# Patient Record
Sex: Female | Born: 1995 | Race: Black or African American | Hispanic: No | Smoking: Never smoker
Health system: Southern US, Community
[De-identification: ages and names within clinical notes are randomized; demographics above are authoritative.]

## PROBLEM LIST (undated history)

## (undated) DIAGNOSIS — R0600 Dyspnea, unspecified: Secondary | ICD-10-CM

## (undated) DIAGNOSIS — R069 Unspecified abnormalities of breathing: Secondary | ICD-10-CM

## (undated) HISTORY — PX: OTHER SURGICAL HISTORY: SHX169

## (undated) HISTORY — DX: Dyspnea, unspecified: R06.00

## (undated) HISTORY — DX: Unspecified abnormalities of breathing: R06.9

---

## 2019-03-28 ENCOUNTER — Ambulatory Visit: Payer: Self-pay | Admitting: Advanced Practice Midwife

## 2019-03-28 ENCOUNTER — Other Ambulatory Visit: Payer: Self-pay

## 2019-03-28 ENCOUNTER — Encounter: Payer: Self-pay | Admitting: Advanced Practice Midwife

## 2019-03-28 DIAGNOSIS — Z113 Encounter for screening for infections with a predominantly sexual mode of transmission: Secondary | ICD-10-CM

## 2019-03-28 DIAGNOSIS — B9689 Other specified bacterial agents as the cause of diseases classified elsewhere: Secondary | ICD-10-CM

## 2019-03-28 DIAGNOSIS — N760 Acute vaginitis: Secondary | ICD-10-CM

## 2019-03-28 LAB — WET PREP FOR TRICH, YEAST, CLUE
Trichomonas Exam: NEGATIVE
Yeast Exam: NEGATIVE

## 2019-03-28 MED ORDER — METRONIDAZOLE 500 MG PO TABS: 500.0000 mg | ORAL_TABLET | Freq: Two times a day (BID) | ORAL | 0 refills | Status: AC

## 2019-03-28 NOTE — Progress Notes (Signed)
  [facility] Department STI clinic/screening visit  Subjective:  Meghan Peck is a 24 y.o.SBF nullip nonsmoker female being seen today for an STI screening visit. The patient reports they do not have symptoms.  Patient reports that they do desire a pregnancy in the next year.   They reported they are not interested in discussing contraception today.  No LMP recorded.   Patient has the following medical conditions:  There are no problems to display for this patient.   Chief Complaint  Patient presents with  . SEXUALLY TRANSMITTED DISEASE    HPI  Patient reports increased white malodorous d/c x couple days.  LMP 03/13/19.  Last sex 03/06/19 without condom.  Last MJ 5 years ago  See flowsheet for further details and programmatic requirements.    The following portions of the patient's history were reviewed and updated as appropriate: allergies, current medications, past medical history, past social history, past surgical history and problem list.  Objective:  There were no vitals filed for this visit.  Physical Exam Vitals and nursing note reviewed.  Constitutional:      Appearance: Normal appearance.  HENT:     Head: Normocephalic and atraumatic.     Mouth/Throat:     Mouth: Mucous membranes are moist.     Pharynx: Oropharynx is clear. No oropharyngeal exudate or posterior oropharyngeal erythema.  Pulmonary:     Effort: Pulmonary effort is normal.  Abdominal:     General: Abdomen is flat.     Palpations: Abdomen is soft. There is no mass.     Tenderness: There is no abdominal tenderness. There is no rebound.  Genitourinary:    General: Normal vulva.     Exam position: Lithotomy position.     Pubic Area: No rash or pubic lice.      Labia:        Right: No rash or lesion.        Left: No rash or lesion.      Vagina: Vaginal discharge (thick white curdy leukorrhea with malodor, ph>4.5) present. No erythema, bleeding or lesions.     Cervix: Normal.     Uterus:  Normal.      Adnexa: Right adnexa normal and left adnexa normal.     Rectum: Normal.  Lymphadenopathy:     Head:     Right side of head: No preauricular or posterior auricular adenopathy.     Left side of head: No preauricular or posterior auricular adenopathy.     Cervical: No cervical adenopathy.     Upper Body:     Right upper body: No supraclavicular or axillary adenopathy.     Left upper body: No supraclavicular or axillary adenopathy.     Lower Body: No right inguinal adenopathy. No left inguinal adenopathy.  Skin:    General: Skin is warm and dry.     Findings: No rash.  Neurological:     Mental Status: She is alert and oriented to person, place, and time.      Assessment and Plan:  Meghan Peck is a 24 y.o. female presenting to the [facility] Department for STI screening  1. Screening examination for venereal disease Treat wet mount per standing orders Immunization nurse consult - WET PREP FOR [place], YEAST, [facility]     Return if symptoms worsen or fail to improve.  No future appointments.  Herbie Saxon, CNM

## 2019-03-28 NOTE — Progress Notes (Signed)
Wet mount reviewed by provider; per verbal by Donnal Moat, CNM, pt treated for BV per standing order. Provider orders completed.

## 2019-08-09 ENCOUNTER — Other Ambulatory Visit: Payer: Self-pay

## 2019-08-09 ENCOUNTER — Ambulatory Visit (LOCAL_COMMUNITY_HEALTH_CENTER): Payer: Self-pay

## 2019-08-09 VITALS — BP 122/73 | Ht 60.0 in | Wt 128.0 lb

## 2019-08-09 DIAGNOSIS — Z3201 Encounter for pregnancy test, result positive: Secondary | ICD-10-CM

## 2019-08-09 LAB — PREGNANCY, URINE: Preg Test, Ur: POSITIVE — AB

## 2019-08-09 MED ORDER — PRENATAL 27-0.8 MG PO TABS: 1.0000 | ORAL_TABLET | Freq: Every day | ORAL | 0 refills | Status: AC

## 2019-08-09 NOTE — Progress Notes (Signed)
UPT positive today. Plans prenatal care at [facility]. Sent to clerk for preadmit. Josie Saunders, RN

## 2019-08-12 ENCOUNTER — Other Ambulatory Visit: Payer: Self-pay

## 2019-08-12 ENCOUNTER — Emergency Department: Payer: Self-pay

## 2019-08-12 DIAGNOSIS — Z3A01 Less than 8 weeks gestation of pregnancy: Secondary | ICD-10-CM | POA: Insufficient documentation

## 2019-08-12 DIAGNOSIS — M79605 Pain in left leg: Secondary | ICD-10-CM

## 2019-08-12 DIAGNOSIS — O1202 Gestational edema, second trimester: Secondary | ICD-10-CM | POA: Insufficient documentation

## 2019-08-12 DIAGNOSIS — Z5321 Procedure and treatment not carried out due to patient leaving prior to being seen by health care provider: Secondary | ICD-10-CM | POA: Insufficient documentation

## 2019-08-12 LAB — BASIC METABOLIC PANEL
Anion gap: 6 (ref 5–15)
BUN: 12 mg/dL (ref 6–20)
CO2: 25 mmol/L (ref 22–32)
Calcium: 9.1 mg/dL (ref 8.9–10.3)
Chloride: 104 mmol/L (ref 98–111)
Creatinine, Ser: 0.68 mg/dL (ref 0.44–1.00)
GFR calc Af Amer: >60 mL/min (ref >60)
GFR calc non Af Amer: >60 mL/min (ref >60)
Glucose, Bld: 98 mg/dL (ref 70–99)
Potassium: 4.1 mmol/L (ref 3.5–5.1)
Sodium: 135 mmol/L (ref 135–145)

## 2019-08-12 LAB — CBC
HCT: 34.8 % — ABNORMAL LOW (ref 36.0–46.0)
Hemoglobin: 11.5 g/dL — ABNORMAL LOW (ref 12.0–15.0)
MCH: 25.7 pg — ABNORMAL LOW (ref 26.0–34.0)
MCHC: 33 g/dL (ref 30.0–36.0)
MCV: 77.7 fL — ABNORMAL LOW (ref 80.0–100.0)
Platelets: 307 10*3/uL (ref 150–400)
RBC: 4.48 MIL/uL (ref 3.87–5.11)
RDW: 14.5 % (ref 11.5–15.5)
WBC: 7.2 10*3/uL (ref 4.0–10.5)
nRBC: 0 % (ref 0.0–0.2)

## 2019-08-12 NOTE — ED Triage Notes (Signed)
Pt to the er for left lower leg swelling on the posterior calf with redness and inflammation present. Pt is currently [redacted] weeks pregnant. Area is large and painful.

## 2019-08-13 ENCOUNTER — Emergency Department
Admission: EM | Admit: 2019-08-13 | Discharge: 2019-08-13 | Disposition: A | Discharge status: Home / Self Care | Payer: Self-pay | Attending: Emergency Medicine | Admitting: Emergency Medicine

## 2019-08-13 DIAGNOSIS — M79605 Pain in left leg: Secondary | ICD-10-CM

## 2019-08-13 LAB — FIBRIN DERIVATIVES D-DIMER (ARMC ONLY): Fibrin derivatives D-dimer (ARMC): 261.67 ng/mL (FEU) (ref 0.00–499.00)

## 2019-08-13 NOTE — ED Notes (Signed)
Patient states she is going to go home. Patient educated about when to seek immediate medical attention including fever, pain, inability to bear weight. Patient understands and will see her primary care tomorrow or Monday. Patient encouraged to return for any further complications or concerns.

## 2019-08-15 ENCOUNTER — Telehealth: Payer: Self-pay | Admitting: Emergency Medicine

## 2019-08-15 NOTE — Telephone Encounter (Signed)
Called patient due to lwot to inquire about condition and follow up plans. Says the leg is actually better.  I told her to call [facility] to let them know her labs results are in and how her leg is doing.

## 2019-09-13 ENCOUNTER — Ambulatory Visit: Payer: Self-pay | Admitting: Family Medicine

## 2019-09-13 ENCOUNTER — Other Ambulatory Visit: Payer: Self-pay

## 2019-09-13 ENCOUNTER — Encounter: Payer: Self-pay | Admitting: Family Medicine

## 2019-09-13 DIAGNOSIS — Z113 Encounter for screening for infections with a predominantly sexual mode of transmission: Secondary | ICD-10-CM

## 2019-09-13 LAB — WET PREP FOR TRICH, YEAST, CLUE
Trichomonas Exam: NEGATIVE
Yeast Exam: NEGATIVE

## 2019-09-13 NOTE — Progress Notes (Signed)
Wet mount reviewed, no tx per standing order.

## 2019-09-13 NOTE — Progress Notes (Signed)
Pt is here for STD screening. Pt feels like she may have a yeast infection. Pt reports she is currently pregnant. Pt has New OB appt here scheduled for 09/27/2019.

## 2019-09-13 NOTE — Progress Notes (Signed)
[facility] Department STI clinic/screening visit  Subjective:  Meghan Peck is a 24 y.o. female being seen today for an STI screening visit. The patient reports they do have symptoms.  Patient reports that they are pregnant, + PT 07/2018.[name]  They reported they are not interested in discussing contraception today.  Patient's last menstrual period was 07/02/2019 (exact date).   Patient has the following medical conditions:  There are no problems to display for this patient.   Chief Complaint  Patient presents with   SEXUALLY TRANSMITTED DISEASE    screening    HPI  Patient reports that she used Monistat 1 week ago for yeast like symptoms but this didn't resolve her symptoms.  States that she continues to have a lot of thick white disch with an odor.  Denies other STD symptoms.  Declines blood work because will have next week with her first PN visit at [facility].   Last HIV test per patient/review of record was unknown Patient reports last pap was unknown.   See flowsheet for further details and programmatic requirements.    The following portions of the patient's history were reviewed and updated as appropriate: allergies, current medications, past medical history, past social history, past surgical history and problem list.  Objective:  There were no vitals filed for this visit.  Physical Exam Vitals and nursing note reviewed.  Constitutional:      Appearance: Normal appearance.  HENT:     Head: Normocephalic and atraumatic.     Mouth/Throat:     Mouth: Mucous membranes are moist.     Pharynx: Oropharynx is clear. No oropharyngeal exudate or posterior oropharyngeal erythema.  Pulmonary:     Effort: Pulmonary effort is normal.  Abdominal:     General: Abdomen is flat.     Palpations: There is no mass.     Tenderness: There is no abdominal tenderness. There is no rebound.  Genitourinary:    General: Normal vulva.     Exam position: Lithotomy position.     Pubic  Area: No rash or pubic lice.      Labia:        Right: No rash or lesion.        Left: No rash or lesion.      Vagina: Vaginal discharge present. No erythema, bleeding or lesions.     Cervix: No discharge, friability, lesion or erythema.     Rectum: Normal.     Comments: Small amount of white discharge, pH <4.5, no odor noted. Bimanual exam not indicated Lymphadenopathy:     Head:     Right side of head: No preauricular or posterior auricular adenopathy.     Left side of head: No preauricular or posterior auricular adenopathy.     Cervical: No cervical adenopathy.     Upper Body:     Right upper body: No supraclavicular or axillary adenopathy.     Left upper body: No supraclavicular or axillary adenopathy.     Lower Body: No right inguinal adenopathy. No left inguinal adenopathy.  Skin:    General: Skin is warm and dry.     Findings: No rash.  Neurological:     Mental Status: She is alert and oriented to person, place, and time.      Assessment and Plan:  [name] is a 24 y.o. female presenting to the [facility] Department for STI screening  1. Screening examination for venereal disease  - WET PREP FOR [place], YEAST,  [place] client of negative wet prep results. Increase in discharge may be due to pregnancy.  Co that the wet prep will be repeated at her PN visit exam.   No follow-ups on file.  Future Appointments  Date Time Provider [place]  09/27/2019  9:20 AM [facility] PROVIDER [facility] None    Hassell Done, FNP

## 2019-09-27 ENCOUNTER — Encounter: Payer: Self-pay | Admitting: Advanced Practice Midwife

## 2019-09-27 ENCOUNTER — Other Ambulatory Visit: Payer: Self-pay

## 2019-09-27 ENCOUNTER — Ambulatory Visit: Payer: Self-pay | Admitting: Advanced Practice Midwife

## 2019-09-27 VITALS — BP 126/78 | HR 79 | Temp 98.3°F | Wt 120.0 lb

## 2019-09-27 DIAGNOSIS — Z3401 Encounter for supervision of normal first pregnancy, first trimester: Secondary | ICD-10-CM

## 2019-09-27 DIAGNOSIS — B9689 Other specified bacterial agents as the cause of diseases classified elsewhere: Secondary | ICD-10-CM

## 2019-09-27 DIAGNOSIS — Z3400 Encounter for supervision of normal first pregnancy, unspecified trimester: Secondary | ICD-10-CM | POA: Insufficient documentation

## 2019-09-27 DIAGNOSIS — N760 Acute vaginitis: Secondary | ICD-10-CM

## 2019-09-27 LAB — URINALYSIS
Bilirubin, UA: NEGATIVE
Glucose, UA: NEGATIVE
Ketones, UA: NEGATIVE
Leukocytes,UA: NEGATIVE
Nitrite, UA: NEGATIVE
Protein,UA: NEGATIVE
RBC, UA: NEGATIVE
Specific Gravity, UA: 1.025 (ref 1.005–1.030)
Urobilinogen, Ur: 0.2 mg/dL (ref 0.2–1.0)
pH, UA: 7 (ref 5.0–7.5)

## 2019-09-27 LAB — WET PREP FOR TRICH, YEAST, CLUE
Trichomonas Exam: NEGATIVE
Yeast Exam: NEGATIVE

## 2019-09-27 LAB — HEMOGLOBIN, FINGERSTICK: Hemoglobin: 13.1 g/dL (ref 11.1–15.9)

## 2019-09-27 MED ORDER — METRONIDAZOLE 250 MG PO TABS: 250.0000 mg | ORAL_TABLET | Freq: Three times a day (TID) | ORAL | 0 refills | Status: AC

## 2019-09-27 NOTE — Progress Notes (Signed)
Patient here for new OB at 12 3/7 weeks by LMP. Patient arrived late for appointment due to My Chart appointment reminder that instructed patient to arrive at 8:50am. RN to follow-up with E. Luna about that issue. Patient agrees to do new OB appointment in 2 parts if provider unable to complete PE due to appointment timing.Jenetta Downer, RN

## 2019-09-27 NOTE — Progress Notes (Signed)
[place] DEPT [facility] [place] [id number]  INITIAL PRENATAL VISIT NOTE  Subjective:  Meghan Peck is a 24 y.o. SBF nonsmoker G1P0000 at [redacted]w[redacted]d being seen today to start prenatal care at the [facility] Department. She feels "great" about planned pregnancy x 5 years.  She is not sure how old FOB is but he feels "good" about pregnancy; she states they were never in a relationship but this was "a one time thing" with FOB who is unemployed and will not be supportive of her or the baby and has no other children.  Pt is employed 40 hrs/wk and living alone,  Denies cigs, vaping, [employer] cigars, last MJ 5 years ago, last ETOH 06/25/19 (4 Loco) --1.  Last sex 08/02/19. LMP 07/02/19. Has been to ER x1 because she thought she had a blood clot in her leg after being bitten by something; on 08/12/19 she went to [facility] ER and had u/s of left leg=neg.  LMP 07/02/19. She is currently monitored for the following issues for this low-risk pregnancy and has Encounter for supervision of normal first pregnancy in first trimester on their problem list.  Patient reports no complaints.   .  .   . Denies leaking of fluid.   Indications for ASA therapy (per uptodate) One of the following: Previous pregnancy with preeclampsia, especially early onset and with an adverse outcome No Multifetal gestation No Chronic hypertension No Type 1 or 2 diabetes mellitus No Chronic kidney disease No Autoimmune disease (antiphospholipid syndrome, systemic lupus erythematosus) No  Two or more of the following: Nulliparity Yes Obesity (body mass index >30 kg/m2) No Family history of preeclampsia in mother or sister No Age ?35 years No Sociodemographic characteristics (African American race, low socioeconomic level) Yes Personal risk factors (eg, previous pregnancy with low birth weight or small for gestational age infant, previous  adverse pregnancy outcome [eg, stillbirth], interval >10 years between pregnancies) No   The following portions of the patient's history were reviewed and updated as appropriate: allergies, current medications, past family history, past medical history, past social history, past surgical history and problem list. Problem list updated.  Objective:   Vitals:   09/27/19 0939  BP: 126/78  Pulse: 79  Temp: 98.3 F (36.8 C)  Weight: 120 lb (54.4 kg)    Fetal Status:            Physical Exam Vitals and nursing note reviewed.  Constitutional:      General: She is not in acute distress.    Appearance: Normal appearance. She is well-developed and normal weight.  HENT:     Head: Normocephalic and atraumatic.     Right Ear: External ear normal.     Left Ear: External ear normal.     Nose: Nose normal. No congestion or rhinorrhea.     Mouth/Throat:     Lips: Pink.     Mouth: Mucous membranes are moist.     Dentition: Normal dentition. No dental caries.     Pharynx: Oropharynx is clear. Uvula midline.  Eyes:     General: No scleral icterus.    Conjunctiva/sclera: Conjunctivae normal.  Neck:     Thyroid: No thyroid mass or thyromegaly.  Cardiovascular:     Rate and Rhythm: Normal rate.     Pulses: Normal pulses.     Comments: Extremities are warm and well perfused Pulmonary:     Effort: Pulmonary effort  is normal.     Breath sounds: Normal breath sounds.  Chest:     Breasts: Breasts are symmetrical.        Right: Normal. No mass, nipple discharge or skin change.        Left: Normal. No mass, nipple discharge or skin change.  Abdominal:     General: Abdomen is flat.     Palpations: Abdomen is soft.     Tenderness: There is no abdominal tenderness.     Comments: Gravid, soft without tenderness, fundal height=12 FHR=160   Genitourinary:    General: Normal vulva.     Exam position: Lithotomy position.     Pubic Area: No rash.      Labia:        Right: No rash.        Left:  No rash.      Vagina: Vaginal discharge (grey creamy malodorous leukorrhea, ph>4.5) present.     Cervix: Normal.     Uterus: Normal. Enlarged (Gravid 12 wk size). Not tender.      Adnexa: Right adnexa normal and left adnexa normal.     Rectum: Normal. No external hemorrhoid.     Comments: Pierced clitoris Musculoskeletal:     Right lower leg: No edema.     Left lower leg: No edema.  Lymphadenopathy:     Upper Body:     Right upper body: No axillary adenopathy.     Left upper body: No axillary adenopathy.  Skin:    General: Skin is warm.     Capillary Refill: Capillary refill takes less than 2 seconds.  Neurological:     Mental Status: She is alert.     Assessment and Plan:  Pregnancy: G1P0000 at [redacted]w[redacted]d  1. Encounter for supervision of normal first pregnancy, first trimester Desires Quad screen Treat wet mount per standing orders Dating u/s ordered  - HCV Ab w Reflex to Quant PCR - Hgb Fractionation Cascade - HIV Antibody (routine testing w rflx) - Urine Culture - [id number] Drug Screen - Prenatal profile without Varicella or Rubella - Varicella zoster antibody, IgG - Hemoglobin, fingerstick - Urinalysis - WET PREP FOR TRICH, YEAST, CLUE - IGP, rfx Aptima HPV ASCU - Chlamydia/Gonococcus/Trichomonas, NAA     Discussed overview of care and coordination with inpatient delivery practices including [facility], [facility], [facility] and [facility].   Reviewed Centering pregnancy as standard of care at [facility], oriented to room and showed video. Based on EDD, plan for Cycle.    Preterm labor symptoms and general obstetric precautions including but not limited to vaginal bleeding, contractions, leaking of fluid and fetal movement were reviewed in detail with the patient.  Please refer to After Visit Summary for other counseling recommendations.   No follow-ups on file.  No future appointments.  Herbie Saxon, CNM

## 2019-09-27 NOTE — Progress Notes (Signed)
Wet mount reviewed, patient treated for BV per SO. Hgb and urine dip normal. Patient counseled to expect call from [facility] to scheduled U/S. Patient also counseled to go to [facility] and to sign up for presumptive medicaid today after lunch hour. Patient states this is her day off from work and she will stay to do both things. Patient next appointment scheduled for 4 weeks.Jenetta Downer, RN

## 2019-09-27 NOTE — Addendum Note (Signed)
Addended by: Jenetta Downer on: 09/27/2019 04:59 PM   Modules accepted: Orders

## 2019-09-28 DIAGNOSIS — Z6791 Unspecified blood type, Rh negative: Secondary | ICD-10-CM | POA: Insufficient documentation

## 2019-09-28 LAB — IGP, RFX APTIMA HPV ASCU: PAP Smear Comment: 0

## 2019-09-28 LAB — HCV INTERPRETATION

## 2019-09-28 LAB — CBC/D/PLT+RPR+RH+ABO+AB SCR
Antibody Screen: NEGATIVE
Basophils Absolute: 0 10*3/uL (ref 0.0–0.2)
Basos: 1 %
EOS (ABSOLUTE): 0.1 10*3/uL (ref 0.0–0.4)
Eos: 1 %
Hematocrit: 41.9 % (ref 34.0–46.6)
Hemoglobin: 13.2 g/dL (ref 11.1–15.9)
Hepatitis B Surface Ag: NEGATIVE
Immature Grans (Abs): 0 10*3/uL (ref 0.0–0.1)
Immature Granulocytes: 0 %
Lymphocytes Absolute: 1.5 10*3/uL (ref 0.7–3.1)
Lymphs: 30 %
MCH: 26.1 pg — ABNORMAL LOW (ref 26.6–33.0)
MCHC: 31.5 g/dL (ref 31.5–35.7)
MCV: 83 fL (ref 79–97)
Monocytes Absolute: 0.4 10*3/uL (ref 0.1–0.9)
Monocytes: 7 %
Neutrophils Absolute: 2.9 10*3/uL (ref 1.4–7.0)
Neutrophils: 61 %
Platelets: 272 10*3/uL (ref 150–450)
RBC: 5.05 x10E6/uL (ref 3.77–5.28)
RDW: 15.8 % — ABNORMAL HIGH (ref 11.7–15.4)
RPR Ser Ql: NONREACTIVE
Rh Factor: NEGATIVE
WBC: 4.9 10*3/uL (ref 3.4–10.8)

## 2019-09-28 LAB — VARICELLA ZOSTER ANTIBODY, IGG: Varicella zoster IgG: 584 index (ref >165)

## 2019-09-28 LAB — HIV ANTIBODY (ROUTINE TESTING W REFLEX): HIV Screen 4th Generation wRfx: NONREACTIVE

## 2019-09-28 LAB — 789231 7+OXYCODONE-BUND
Amphetamines, Urine: NEGATIVE ng/mL
BENZODIAZ UR QL: NEGATIVE ng/mL
Barbiturate screen, urine: NEGATIVE ng/mL
Cannabinoid Quant, Ur: NEGATIVE ng/mL
Cocaine (Metab.): NEGATIVE ng/mL
OPIATE SCREEN URINE: NEGATIVE ng/mL
Oxycodone/Oxymorphone, Urine: NEGATIVE ng/mL
PCP Quant, Ur: NEGATIVE ng/mL

## 2019-09-28 LAB — HCV AB W REFLEX TO QUANT PCR: HCV Ab: <0.1 s/co ratio (ref 0.0–0.9)

## 2019-09-28 LAB — HGB FRACTIONATION CASCADE
Hgb A2: 2.5 % (ref 1.8–3.2)
Hgb A: 97.5 % (ref 96.4–98.8)
Hgb F: 0 % (ref 0.0–2.0)
Hgb S: 0 %

## 2019-09-29 LAB — URINE CULTURE: Organism ID, Bacteria: NO GROWTH

## 2019-09-29 LAB — CHLAMYDIA/GC NAA, CONFIRMATION
Chlamydia trachomatis, NAA: NEGATIVE
Neisseria gonorrhoeae, NAA: NEGATIVE

## 2019-10-25 ENCOUNTER — Ambulatory Visit: Payer: Self-pay | Admitting: Advanced Practice Midwife

## 2019-10-25 ENCOUNTER — Other Ambulatory Visit: Payer: Self-pay

## 2019-10-25 VITALS — BP 118/68 | HR 81 | Temp 97.4°F | Wt 122.0 lb

## 2019-10-25 DIAGNOSIS — Z3401 Encounter for supervision of normal first pregnancy, first trimester: Secondary | ICD-10-CM

## 2019-10-25 NOTE — Progress Notes (Signed)
Patient here for MH RV at 16 3/7. Desires quad screen today. Rh negative counseling done today and handout given.Jenetta Downer, RN

## 2019-10-25 NOTE — Progress Notes (Signed)
   PRENATAL VISIT NOTE  Subjective:  Meghan Peck is a 24 y.o. G1P0000 at [redacted]w[redacted]d being seen today for ongoing prenatal care.  She is currently monitored for the following issues for this low-risk pregnancy and has Encounter for supervision of normal first pregnancy in first trimester and Blood type, Rh negative on their problem list.  Patient reports no complaints.  Contractions: Not present. Vag. Bleeding: None.  Movement: Absent. Denies leaking of fluid/ROM.   The following portions of the patient's history were reviewed and updated as appropriate: allergies, current medications, past family history, past medical history, past social history, past surgical history and problem list. Problem list updated.  Objective:   Vitals:   10/25/19 0835  BP: 118/68  Pulse: 81  Temp: (!) 97.4 F (36.3 C)  Weight: 122 lb (55.3 kg)    Fetal Status: Fetal Heart Rate (bpm): 150 Fundal Height: 16 cm Movement: Absent     General:  Alert, oriented and cooperative. Patient is in no acute distress.  Skin: Skin is warm and dry. No rash noted.   Cardiovascular: Normal heart rate noted  Respiratory: Normal respiratory effort, no problems with respiration noted  Abdomen: Soft, gravid, appropriate for gestational age.  Pain/Pressure: Absent     Pelvic: Cervical exam deferred        Extremities: Normal range of motion.  Edema: None  Mental Status: Normal mood and affect. Normal behavior. Normal judgment and thought content.   Assessment and Plan:  Pregnancy: G1P0000 at [redacted]w[redacted]d  1. Encounter for supervision of normal first pregnancy in first trimester Feeling well.  Reviewed 10/18/19 u/s at 15 4/7 wks with posterior placenta.  Desires Quad screen today.  Working 40 hrs/wk at [employer].  Living alone.  Pt reminded of 11/22/19 u/s   Preterm labor symptoms and general obstetric precautions including but not limited to vaginal bleeding, contractions, leaking of fluid and fetal movement were reviewed in detail with  the patient. Please refer to After Visit Summary for other counseling recommendations.  No follow-ups on file.  No future appointments.  Herbie Saxon, CNM

## 2019-11-22 ENCOUNTER — Ambulatory Visit: Payer: Self-pay | Admitting: Advanced Practice Midwife

## 2019-11-22 ENCOUNTER — Other Ambulatory Visit: Payer: Self-pay

## 2019-11-22 VITALS — BP 126/74 | HR 80 | Temp 97.8°F | Wt 125.4 lb

## 2019-11-22 DIAGNOSIS — Z3401 Encounter for supervision of normal first pregnancy, first trimester: Secondary | ICD-10-CM

## 2019-11-22 LAB — HEMOGLOBIN, FINGERSTICK: Hemoglobin: 12.2 g/dL (ref 11.1–15.9)

## 2019-11-22 NOTE — Progress Notes (Addendum)
Patient here for MH RV at 20 3/7. States she went for U/S to find out sex of baby, girl. She canceled her [facility] anatomy U/S because of concerns about cost. States she has applied for Medicaid and waiting for response.  Patient c/o of episodic SOB, states never diagosed with asthma but was given inhaler "years ago", but no longer has it.Jenetta Downer, RN

## 2019-11-22 NOTE — Progress Notes (Signed)
Hgb 12.2, peak flows done per provider orders. Patient peak flow values, 270, 290, 300, with good effort. Provider reviewed values, no new orders. Patient sent to talk with finance about Medicaid and income, to assess whether she can have low-cost U/S at [facility]. Patient counseled to let us know if Medicaid comes through and she wants to reschedule anatomy U/S. Patient states understanding.Jenetta Downer, RN

## 2019-11-22 NOTE — Progress Notes (Signed)
   PRENATAL VISIT NOTE  Subjective:  Meghan Peck is a 24 y.o. G1P0000 at [redacted]w[redacted]d being seen today for ongoing prenatal care.  She is currently monitored for the following issues for this low-risk pregnancy and has Encounter for supervision of normal first pregnancy in first trimester and Blood type, Rh negative on their problem list.  Patient reports no complaints.  Contractions: Not present. Vag. Bleeding: None.  Movement: Present. Denies leaking of fluid/ROM.   The following portions of the patient's history were reviewed and updated as appropriate: allergies, current medications, past family history, past medical history, past social history, past surgical history and problem list. Problem list updated.  Objective:   Vitals:   11/22/19 0842  BP: 126/74  Pulse: 80  Temp: 97.8 F (36.6 C)  Weight: 125 lb 6.4 oz (56.9 kg)    Fetal Status: Fetal Heart Rate (bpm): 160 Fundal Height: 20 cm Movement: Present     General:  Alert, oriented and cooperative. Patient is in no acute distress.  Skin: Skin is warm and dry. No rash noted.   Cardiovascular: Normal heart rate noted  Respiratory: Normal respiratory effort, no problems with respiration noted  Abdomen: Soft, gravid, appropriate for gestational age.  Pain/Pressure: Absent     Pelvic: Cervical exam deferred        Extremities: Normal range of motion.  Edema: None  Mental Status: Normal mood and affect. Normal behavior. Normal judgment and thought content.   Assessment and Plan:  Pregnancy: G1P0000 at [redacted]w[redacted]d  1. Encounter for supervision of normal first pregnancy in first trimester Pt cancelled anatomy u/s for today because she had a 4D gender reveal u/s on 10/29/19.  Discussed with pt that this u/s is an anatomy u/s and importance of this as well as review of 10/18/19 u/s at 15 4/7 wks that said "too early to r/o previa".  Pt states she has no Medicaid and no insurance and can't afford this u/s.  Pt to let us know if she changes her  mind and wants to have this u/s rescheduled.  Working 30 hrs/wk at [employer]. States intermittent SOB  Continues with onset age 60 and no dx asthma, resolves spontaneously.  Hgb today and peak flows: 270, 290, 300 - Hemoglobin, fingerstick   Preterm labor symptoms and general obstetric precautions including but not limited to vaginal bleeding, contractions, leaking of fluid and fetal movement were reviewed in detail with the patient. Please refer to After Visit Summary for other counseling recommendations.  Return in about 4 weeks (around 12/20/2019) for routine PNC.  No future appointments.  Herbie Saxon, CNM

## 2019-11-25 ENCOUNTER — Telehealth: Payer: Self-pay | Admitting: Family Medicine

## 2019-11-25 NOTE — Telephone Encounter (Signed)
Patient states was told by doctor that she needs to call [facility] for them to make appointment for [place]. Patient is being seen here for prenatal.

## 2019-11-29 ENCOUNTER — Telehealth: Payer: Self-pay

## 2019-11-29 NOTE — Telephone Encounter (Signed)
Patient called this morning saying that her Medicaid is in order and she would like to reschedule her [facility] U/S. TC to [facility] U/S and talked with Tye Maryland who states she will call patient to schedule. Patient requested a Tuesday or Wednesday appointment, as those are her days off from work. Per Tye Maryland, the next available appointments are 12/14 and 01/04/20. Per Dr. Ernestina Patches, ok to scheduled at that time.  TC to patient and LM telling her to expect a phone call from [facility] about her U/S appointment. Jenetta Downer, RN

## 2019-12-06 NOTE — Addendum Note (Signed)
Addended by: Cletis Media on: 12/06/2019 12:27 PM   Modules accepted: Orders

## 2019-12-20 ENCOUNTER — Ambulatory Visit: Payer: Self-pay

## 2019-12-29 ENCOUNTER — Other Ambulatory Visit: Payer: Self-pay

## 2019-12-29 ENCOUNTER — Ambulatory Visit: Payer: Medicaid Other | Admitting: Family Medicine

## 2019-12-29 VITALS — BP 123/76 | HR 75 | Temp 97.6°F | Wt 129.4 lb

## 2019-12-29 DIAGNOSIS — Z3401 Encounter for supervision of normal first pregnancy, first trimester: Secondary | ICD-10-CM

## 2019-12-29 MED ORDER — PRENATAL VITAMIN 27-0.8 MG PO TABS: 1.0000 | ORAL_TABLET | Freq: Every day | ORAL | 0 refills | Status: AC

## 2019-12-29 NOTE — Progress Notes (Signed)
   PRENATAL VISIT NOTE  Subjective:  Meghan Peck is a 24 y.o. G1P0000 at [redacted]w[redacted]d being seen today for ongoing prenatal care.  She is currently monitored for the following issues for this low-risk pregnancy and has Encounter for supervision of normal first pregnancy in first trimester and Blood type, Rh negative on their problem list.  Patient reports no complaints.  Contractions: Not present. Vag. Bleeding: None.  Movement: Present. Denies leaking of fluid/ROM.   The following portions of the patient's history were reviewed and updated as appropriate: allergies, current medications, past family history, past medical history, past social history, past surgical history and problem list. Problem list updated.  Objective:   Vitals:   12/29/19 0815  BP: 123/76  Pulse: 75  Temp: 97.6 F (36.4 C)  Weight: 129 lb 6.4 oz (58.7 kg)    Fetal Status: Fetal Heart Rate (bpm): 163 Fundal Height: 25 cm Movement: Present     General:  Alert, oriented and cooperative. Patient is in no acute distress.  Skin: Skin is warm and dry. No rash noted.   Cardiovascular: Normal heart rate noted  Respiratory: Normal respiratory effort, no problems with respiration noted  Abdomen: Soft, gravid, appropriate for gestational age.  Pain/Pressure: Absent     Pelvic: Cervical exam deferred        Extremities: Normal range of motion.  Edema: None  Mental Status: Normal mood and affect. Normal behavior. Normal judgment and thought content.   Assessment and Plan:  Pregnancy: G1P0000 at [redacted]w[redacted]d  1. Encounter for supervision of normal first pregnancy in first trimester  Patient reports that she is doing well with this pregnancy   Reports the need for more PNV,    Discussed expectations for next visit, 28 week labs.    Patient is aware of appointment at [facility] for U/S, reported to RN that she may have to change that appointment d/t having court date.    Discussed exercise and nutrition and weight gain expectations  during pregnancy, Exercise for lower back pain  Information sheet given.     Birth control postpartum  was declined d/t same sex relationship, patient had donor partner.  Plans on monogamy with female partner of 3 years after delivery.    Preterm labor symptoms and general obstetric precautions including but not limited to vaginal bleeding, contractions, leaking of fluid and fetal movement were reviewed in detail with the patient. Please refer to After Visit Summary for other counseling recommendations.   Return in about 3 weeks (around 01/19/2020) for 28 week labs, routine prenatal care appt before 10:30am  or 3 pm .  No future appointments.  Junious Dresser, FNP

## 2019-12-29 NOTE — Progress Notes (Signed)
PNV dispensed per provider.

## 2019-12-29 NOTE — Progress Notes (Signed)
Pt denies visits to ER since last visit with [facility]. Taking PNV, needs additional supply. Discussed PTL handout and copy provided to pt.

## 2020-01-17 ENCOUNTER — Ambulatory Visit: Payer: Medicaid Other | Admitting: Family Medicine

## 2020-01-17 ENCOUNTER — Other Ambulatory Visit: Payer: Self-pay

## 2020-01-17 VITALS — BP 115/69 | HR 89 | Temp 97.7°F | Wt 129.0 lb

## 2020-01-17 DIAGNOSIS — Z6791 Unspecified blood type, Rh negative: Secondary | ICD-10-CM

## 2020-01-17 DIAGNOSIS — Z3403 Encounter for supervision of normal first pregnancy, third trimester: Secondary | ICD-10-CM

## 2020-01-17 DIAGNOSIS — O26893 Other specified pregnancy related conditions, third trimester: Secondary | ICD-10-CM | POA: Diagnosis not present

## 2020-01-17 DIAGNOSIS — Z3401 Encounter for supervision of normal first pregnancy, first trimester: Secondary | ICD-10-CM

## 2020-01-17 LAB — HEMOGLOBIN, FINGERSTICK: Hemoglobin: 12.1 g/dL (ref 11.1–15.9)

## 2020-01-17 MED ORDER — RHO D IMMUNE GLOBULIN 1500 UNIT/2ML IJ SOSY
300.0000 ug | PREFILLED_SYRINGE | Freq: Once | INTRAMUSCULAR | Status: AC
  Administered 2020-01-17: 11:00:00 300 ug via INTRAMUSCULAR

## 2020-01-17 NOTE — Progress Notes (Signed)
   PRENATAL VISIT NOTE  Subjective:  Meghan Peck is a 24 y.o. G1P0000 at [redacted]w[redacted]d being seen today for ongoing prenatal care.  She is currently monitored for the following issues for this low-risk pregnancy and has Encounter for supervision of normal first pregnancy in first trimester and Blood type, Rh negative on their problem list.  Patient reports no complaints.  Contractions: Not present. Vag. Bleeding: None.  Movement: Present. Denies leaking of fluid/ROM.   The following portions of the patient's history were reviewed and updated as appropriate: allergies, current medications, past family history, past medical history, past social history, past surgical history and problem list. Problem list updated.  Objective:   Vitals:   01/17/20 0908  BP: 115/69  Pulse: 89  Temp: 97.7 F (36.5 C)  Weight: 129 lb (58.5 kg)    Fetal Status: Fetal Heart Rate (bpm): 150 Fundal Height: 28 cm Movement: Present     General:  Alert, oriented and cooperative. Patient is in no acute distress.  Skin: Skin is warm and dry. No rash noted.   Cardiovascular: Normal heart rate noted  Respiratory: Normal respiratory effort, no problems with respiration noted  Abdomen: Soft, gravid, appropriate for gestational age.  Pain/Pressure: Absent     Pelvic: Cervical exam deferred        Extremities: Normal range of motion.  Edema: None  Mental Status: Normal mood and affect. Normal behavior. Normal judgment and thought content.   Assessment and Plan:  Pregnancy: G1P0000 at [redacted]w[redacted]d  1. Encounter for supervision of normal first pregnancy in first trimester  Reviewed U/S, fibroids present in left lateral wall, low  No complaints from patient, denies back pain today , reports exercises helped.  Encouraged to increase walking.     28 week labs done today.    Discussed change in visit frequency.  Needs to get tdap in two weeks.   Rhogam given today.     - Hemoglobin, venipuncture - Glucose, 1 hour  gestational - HIV-1/HIV-2 Qualitative RNA - RPR - Antibody screen   Preterm labor symptoms and general obstetric precautions including but not limited to vaginal bleeding, contractions, leaking of fluid and fetal movement were reviewed in detail with the patient. Please refer to After Visit Summary for other counseling recommendations.  Return in about 2 weeks (around 01/31/2020) for routine prenatal care, tdap.  No future appointments.  Wendi Snipes, FNP

## 2020-01-17 NOTE — Progress Notes (Signed)
Counseled regarding flu vaccine and Tdap recommendations. Declined flu vaccines and plans Tdap in 2 weeks at same time as partner. 28 week labs today, including antibody screen. Jossie Ng, RN  Tolerated Rhophylac without complaint. Jossie Ng, RN  Hgb = 12.1 and no interventions required per standing order. Jossie Ng, RN

## 2020-01-18 LAB — GLUCOSE, 1 HOUR GESTATIONAL: Gestational Diabetes Screen: 87 mg/dL (ref 65–139)

## 2020-01-19 LAB — ANTIBODY SCREEN: Antibody Screen: NEGATIVE

## 2020-01-19 LAB — HIV-1/HIV-2 QUALITATIVE RNA
HIV-1 RNA, Qualitative: NONREACTIVE
HIV-2 RNA, Qualitative: NONREACTIVE

## 2020-01-19 LAB — RPR: RPR Ser Ql: NONREACTIVE

## 2020-01-31 ENCOUNTER — Ambulatory Visit: Payer: Medicaid Other | Admitting: Advanced Practice Midwife

## 2020-01-31 ENCOUNTER — Other Ambulatory Visit: Payer: Self-pay

## 2020-01-31 ENCOUNTER — Encounter: Payer: Self-pay | Admitting: Family Medicine

## 2020-01-31 VITALS — BP 125/52 | HR 94 | Temp 97.1°F | Wt 130.6 lb

## 2020-01-31 DIAGNOSIS — O3410 Maternal care for benign tumor of corpus uteri, unspecified trimester: Secondary | ICD-10-CM | POA: Insufficient documentation

## 2020-01-31 DIAGNOSIS — D259 Leiomyoma of uterus, unspecified: Secondary | ICD-10-CM | POA: Insufficient documentation

## 2020-01-31 DIAGNOSIS — Z23 Encounter for immunization: Secondary | ICD-10-CM

## 2020-01-31 DIAGNOSIS — Z3401 Encounter for supervision of normal first pregnancy, first trimester: Secondary | ICD-10-CM | POA: Diagnosis not present

## 2020-01-31 NOTE — Progress Notes (Signed)
Counseled on recommendation for Tdap in pregnancy and for those who will be around infant. Per client, partner had Tdap 5 years ago. Client tolerated Tdap today without complaint. Declined flu vaccine again. Rich Number, RN

## 2020-01-31 NOTE — Progress Notes (Signed)
Patient here for [facility] RV at 30 3/7. Kick counts reviewed and cards given today. Patient desires Tdap today.Jenetta Downer, RN

## 2020-01-31 NOTE — Progress Notes (Signed)
   PRENATAL VISIT NOTE  Subjective:  Meghan Peck is a 25 y.o. G1P0000 at [redacted]w[redacted]d being seen today for ongoing prenatal care.  She is currently monitored for the following issues for this low-risk pregnancy and has Encounter for supervision of normal first pregnancy in first trimester and Blood type, Rh negative on their problem list.  Patient reports no complaints.  Contractions: Not present.  .  Movement: Present. Denies leaking of fluid/ROM.   The following portions of the patient's history were reviewed and updated as appropriate: allergies, current medications, past family history, past medical history, past social history, past surgical history and problem list. Problem list updated.  Objective:   Vitals:   01/31/20 0841  BP: (!) 125/52  Pulse: 94  Temp: (!) 97.1 F (36.2 C)  Weight: 130 lb 9.6 oz (59.2 kg)    Fetal Status:   Fundal Height: 30 cm Movement: Present     General:  Alert, oriented and cooperative. Patient is in no acute distress.  Skin: Skin is warm and dry. No rash noted.   Cardiovascular: Normal heart rate noted  Respiratory: Normal respiratory effort, no problems with respiration noted  Abdomen: Soft, gravid, appropriate for gestational age.  Pain/Pressure: Absent     Pelvic: Cervical exam deferred        Extremities: Normal range of motion.  Edema: None  Mental Status: Normal mood and affect. Normal behavior. Normal judgment and thought content.   Assessment and Plan:  Pregnancy: G1P0000 at [redacted]w[redacted]d  1. Encounter for supervision of normal first pregnancy in first trimester Reviewed 01/16/20 u/s at 27 5/7 wks with 3 fibroids ~38 mm on left lateral uterine wall with wording "it to be obstructing the cx"--RN to call [facility] and clarify with ultrasonographer if fibroids are blocking cx or not and obtain addended u/s report.  EFW=38%, 3VC, AFI wnl. Working 30 hrs/wk at [employer].  Not in school.  No car seat yet.  Baby shower in Feb.  Has not had flu vax. To get second  Covid vaccine 02/06/20   Preterm labor symptoms and general obstetric precautions including but not limited to vaginal bleeding, contractions, leaking of fluid and fetal movement were reviewed in detail with the patient. Please refer to After Visit Summary for other counseling recommendations.  No follow-ups on file.  No future appointments.  Herbie Saxon, CNM

## 2020-02-15 ENCOUNTER — Ambulatory Visit: Payer: Medicaid Other | Admitting: Advanced Practice Midwife

## 2020-02-15 ENCOUNTER — Other Ambulatory Visit: Payer: Self-pay

## 2020-02-15 VITALS — BP 122/62 | HR 73 | Temp 98.0°F | Wt 131.0 lb

## 2020-02-15 DIAGNOSIS — D259 Leiomyoma of uterus, unspecified: Secondary | ICD-10-CM

## 2020-02-15 DIAGNOSIS — Z3401 Encounter for supervision of normal first pregnancy, first trimester: Secondary | ICD-10-CM

## 2020-02-15 DIAGNOSIS — O3410 Maternal care for benign tumor of corpus uteri, unspecified trimester: Secondary | ICD-10-CM

## 2020-02-15 LAB — URINALYSIS
Bilirubin, UA: NEGATIVE
Glucose, UA: NEGATIVE
Ketones, UA: NEGATIVE
Leukocytes,UA: NEGATIVE
Nitrite, UA: NEGATIVE
Protein,UA: NEGATIVE
RBC, UA: NEGATIVE
Specific Gravity, UA: 1.025 (ref 1.005–1.030)
Urobilinogen, Ur: 1 mg/dL (ref 0.2–1.0)
pH, UA: 6.5 (ref 5.0–7.5)

## 2020-02-15 NOTE — Progress Notes (Signed)
Pt denies visits to ER since last appt with [facility]. Taking PNV. Counseled about kick counts and card provided.

## 2020-02-15 NOTE — Progress Notes (Signed)
Manual BP recheck 122/62, reviewed with provider. Pt accepted PCP list. Pt sent to lab for urinalysis. Pt provided copy of [facility]. Urinalysis reviewed by provider, no tx.

## 2020-02-15 NOTE — Progress Notes (Signed)
   PRENATAL VISIT NOTE  Subjective:  Meghan Peck is a 25 y.o. G1P0000 at [redacted]w[redacted]d being seen today for ongoing prenatal care.  She is currently monitored for the following issues for this low-risk pregnancy and has Encounter for supervision of normal first pregnancy in first trimester; Blood type, Rh negative; and Uterine fibroid in pregnancy on their problem list.  Patient reports no complaints.  Contractions: Not present. Vag. Bleeding: None.  Movement: Present. Denies leaking of fluid/ROM.   The following portions of the patient's history were reviewed and updated as appropriate: allergies, current medications, past family history, past medical history, past social history, past surgical history and problem list. Problem list updated.  Objective:   Vitals:   02/15/20 0830  BP: 129/84  Pulse: 73  Temp: 98 F (36.7 C)  Weight: 131 lb (59.4 kg)    Fetal Status: Fetal Heart Rate (bpm): 160 Fundal Height: 31 cm Movement: Present     General:  Alert, oriented and cooperative. Patient is in no acute distress.  Skin: Skin is warm and dry. No rash noted.   Cardiovascular: Normal heart rate noted  Respiratory: Normal respiratory effort, no problems with respiration noted  Abdomen: Soft, gravid, appropriate for gestational age.  Pain/Pressure: Absent     Pelvic: Cervical exam deferred        Extremities: Normal range of motion.  Edema: None  Mental Status: Normal mood and affect. Normal behavior. Normal judgment and thought content.   Assessment and Plan:  Pregnancy: G1P0000 at [redacted]w[redacted]d  1. Encounter for supervision of normal first pregnancy in first trimester Feels well.  Working 30 hrs/wk. Has car seat.  Got second covid vaccine 02/06/20.   BP rising sl.  U/A today and repeat BP (129/84,  122/63)).  Denies h/a, scotoma Pt states 4 mo ago she ran into her bedroom door and hit forehead; no break in skin but bump which decreased in size but has not resolved. 1 cm sl elevated skin colored  area above left eye--referred to primary care MD.  Please give primary care MD list to pt - Urinalysis (Urine Dip)  2. Uterine fibroid in pregnancy    Preterm labor symptoms and general obstetric precautions including but not limited to vaginal bleeding, contractions, leaking of fluid and fetal movement were reviewed in detail with the patient. Please refer to After Visit Summary for other counseling recommendations.  No follow-ups on file.  No future appointments.  Herbie Saxon, CNM

## 2020-02-24 ENCOUNTER — Telehealth: Payer: Self-pay | Admitting: Student

## 2020-02-24 NOTE — Telephone Encounter (Signed)
At 1215 today, client called to report flu-like symptoms. Describes sore throat, runny nose, headache, some nausea, and "feels hot" but does not have home thermometer to check for fever. Reports normal fetal movement. Denies vision changes, CP, SOB, vaginal bleeding, swelling. Symptoms began yesterday on 2/3. Client took home antigen test and it was negative. Per Chesley Mires, CNM, patient should receive a PCR test today, and call HD to report positive symptoms before reporting to 2/9 apt. Pt also counseled per provider, to take regular tylenol for symptoms, and DO NOT continue taking Tylenol Cold & Flu. Pt verbalized understanding. Address given for [employer] testing site that offers drive-thru COVID PCR testing at no charge. Pt has no other questions or concerns at this time.  Barkley Boards, RN

## 2020-02-27 ENCOUNTER — Telehealth: Payer: Self-pay

## 2020-02-27 NOTE — Telephone Encounter (Signed)
[name] transferred call to [facility]. Client calling to verify if acceptable to take 20 mL of liquid Tylenol she just bought. Client reported to RN 160 mg Tylenol per 5 mL and directions are to take 20 mL per dose (640 mg / 20 mL). Client counseled that acceptable to take that dose of Tylenol per bottle directions. Client counseled that Tylenol will not help with cough or nasal congestion. Per client, she is taking for a headache (taking liquid as thinks works faster than a tablet or capsule). Client encouraged to refer to Safe Medications in Pregnancy list received at initial appt and client states she has list). Rich Number, RN

## 2020-02-27 NOTE — Telephone Encounter (Signed)
Call to client to assess symptoms, Covid test results and reschedule [facility] RV appt as needed. Per client, tested at [facility] on 02/24/2020 and received positive results on 02/26/2020. Reports continued nasal congestion, non-productive cough and mucus in throat (swallows and unable to give RN description). Unsure if has fever as never purchased thermometer. Continues to take Tylenol. Encouraged to have someone purchase a thermometer for her. [facility] appt to be rescheduled for 03/05/2020, but client states will be out of town. Reminded that needs to remain at home until symptoms improve. Client scheduled appt for 03/06/2020 and to notify [facility] via phone call prior to appt if symptoms have not yet improved. Client questioning if she can take immune booster => gummy Elderberry with calcium and zinc. Encouraged to not take at this time as PNV contain needed zinc and calcium. Rich Number, RN

## 2020-02-29 ENCOUNTER — Ambulatory Visit: Payer: Medicaid Other

## 2020-03-05 ENCOUNTER — Telehealth: Payer: Self-pay

## 2020-03-05 NOTE — Telephone Encounter (Signed)
Patient called saying she is still having a productive cough and congestion in her head after being diagnosed with covid on 02/26/2020. Patient appointment rescheduled until 03/13/2020 at which time patient can do 36 week labs.Jenetta Downer, RN

## 2020-03-06 ENCOUNTER — Ambulatory Visit: Payer: Medicaid Other

## 2020-03-12 ENCOUNTER — Telehealth: Payer: Self-pay

## 2020-03-12 DIAGNOSIS — U071 COVID-19: Secondary | ICD-10-CM | POA: Insufficient documentation

## 2020-03-12 NOTE — Telephone Encounter (Signed)
TC to patient to reschedule 03/13/20 appointment for a time later this week. Patient will need 36 week labs this week. LM with number to call.Jenetta Downer, RN

## 2020-03-13 ENCOUNTER — Ambulatory Visit: Payer: Medicaid Other

## 2020-03-13 NOTE — Telephone Encounter (Signed)
Return call from client who requested appt be rescheduled for 03/19/2020. Client aware to arrive at 8:00 am. Rich Number, RN

## 2020-03-13 NOTE — Telephone Encounter (Signed)
Call to client at ~ 0750 in order to reschedule am appt due to building issue. Left message to call and number provided. Rich Number, RN

## 2020-03-13 NOTE — Telephone Encounter (Signed)
Client needs appt rescheduled for [facility] RV. Rich Number, RN

## 2020-03-19 ENCOUNTER — Ambulatory Visit: Payer: Medicaid Other | Admitting: Family Medicine

## 2020-03-19 ENCOUNTER — Other Ambulatory Visit: Payer: Self-pay

## 2020-03-19 VITALS — BP 125/82 | HR 75 | Temp 97.6°F | Wt 132.8 lb

## 2020-03-19 DIAGNOSIS — O3410 Maternal care for benign tumor of corpus uteri, unspecified trimester: Secondary | ICD-10-CM

## 2020-03-19 DIAGNOSIS — U071 COVID-19: Secondary | ICD-10-CM

## 2020-03-19 DIAGNOSIS — Z3400 Encounter for supervision of normal first pregnancy, unspecified trimester: Secondary | ICD-10-CM

## 2020-03-19 DIAGNOSIS — D259 Leiomyoma of uterus, unspecified: Secondary | ICD-10-CM

## 2020-03-19 DIAGNOSIS — Z6791 Unspecified blood type, Rh negative: Secondary | ICD-10-CM

## 2020-03-19 DIAGNOSIS — Z3A37 37 weeks gestation of pregnancy: Secondary | ICD-10-CM

## 2020-03-19 DIAGNOSIS — Z3403 Encounter for supervision of normal first pregnancy, third trimester: Secondary | ICD-10-CM

## 2020-03-19 NOTE — Progress Notes (Signed)
  PRENATAL VISIT NOTE  Subjective:  Meghan Peck is a 25 y.o. G1P0000 at [redacted]w[redacted]d being seen today for ongoing prenatal care.  She is currently monitored for the following issues for this low-risk pregnancy and has Supervision of normal first pregnancy, antepartum; Blood type, Rh negative; Uterine fibroid in pregnancy; and Lab test positive for detection of COVID-19 virus on their problem list.  Patient reports no complaints.  Contractions: Not present. Vag. Bleeding: Scant.  Movement: Present. Reports some spotting after sex last week, isolated incidence and not continued since. It was only present for <24 hours.  Denies leaking of fluid/ROM.   The following portions of the patient's history were reviewed and updated as appropriate: allergies, current medications, past family history, past medical history, past social history, past surgical history and problem list. Problem list updated.  Objective:   Vitals:   03/19/20 0848  BP: 125/82  Pulse: 75  Temp: 97.6 F (36.4 C)  Weight: 132 lb 12.8 oz (60.2 kg)    Fetal Status: Fetal Heart Rate (bpm): 140 Fundal Height: 38 cm Movement: Present  Presentation: Vertex  General:  Alert, oriented and cooperative. Patient is in no acute distress.  Skin: Skin is warm and dry. No rash noted.   Cardiovascular: Normal heart rate noted  Respiratory: Normal respiratory effort, no problems with respiration noted  Abdomen: Soft, gravid, appropriate for gestational age.  Pain/Pressure: Absent     Pelvic: Cervical exam deferred        Extremities: Normal range of motion.  Edema: None  Mental Status: Normal mood and affect. Normal behavior. Normal judgment and thought content.   Assessment and Plan:  Pregnancy: G1P0000 at [redacted]w[redacted]d  1. Encounter for supervision of normal first pregnancy in third trimester Up to date Needs peds provider- discussed [facility] as possible ped provider Reviewed care in hospital by [facility] team Discussed postpartum  support in the home with family/friends/partner Desires to breastfeed. Discussed focusing on at the breast nursing for 2-3 weeks and use of manual pump from hospital. If she has [facility] she can get a DEBP at 4 wks if exclusively breastmilk feeding. Also possible pump rental prior if needed.  - Chlamydia/GC NAA, Confirmation - GBS culture  2. Uterine fibroid in pregnancy  3. Blood type, Rh negative S/p rhogam 01/17/20  4. Lab test positive for detection of COVID-19 virus 02/26/20- POS  5. [redacted] weeks gestation of pregnancy   Term labor symptoms and general obstetric precautions including but not limited to vaginal bleeding, contractions, leaking of fluid and fetal movement were reviewed in detail with the patient. Please refer to After Visit Summary for other counseling recommendations.   Return in about 1 week (around 03/26/2020) for Routine prenatal care, in person.  Future Appointments  Date Time Provider [place]  03/26/2020  9:00 AM [facility] PROVIDER [facility] None    Caren Macadam, MD

## 2020-03-19 NOTE — Progress Notes (Signed)
Declined self-collection of 36 week cultures. Rich Number, RN

## 2020-03-21 LAB — CHLAMYDIA/GC NAA, CONFIRMATION
Chlamydia trachomatis, NAA: NEGATIVE
Neisseria gonorrhoeae, NAA: NEGATIVE

## 2020-03-23 LAB — CULTURE, BETA STREP (GROUP B ONLY): Strep Gp B Culture: NEGATIVE

## 2020-03-26 ENCOUNTER — Telehealth: Payer: Self-pay

## 2020-03-26 ENCOUNTER — Ambulatory Visit: Payer: Medicaid Other | Admitting: Student

## 2020-03-26 NOTE — Telephone Encounter (Signed)
[facility] as scheduled in [facility] this am for RV. Call to client and left message requesting callback to schedule appt. Number to call provided. Rich Number, RN

## 2020-03-27 NOTE — Telephone Encounter (Signed)
Pt has appt scheduled for 04/02/20.

## 2020-04-02 ENCOUNTER — Encounter: Payer: Self-pay | Admitting: Advanced Practice Midwife

## 2020-04-02 ENCOUNTER — Other Ambulatory Visit: Payer: Self-pay

## 2020-04-02 ENCOUNTER — Ambulatory Visit: Payer: Medicaid Other | Admitting: Advanced Practice Midwife

## 2020-04-02 VITALS — BP 139/87 | HR 68 | Temp 97.1°F | Wt 136.8 lb

## 2020-04-02 DIAGNOSIS — D259 Leiomyoma of uterus, unspecified: Secondary | ICD-10-CM

## 2020-04-02 DIAGNOSIS — O3410 Maternal care for benign tumor of corpus uteri, unspecified trimester: Secondary | ICD-10-CM

## 2020-04-02 DIAGNOSIS — O163 Unspecified maternal hypertension, third trimester: Secondary | ICD-10-CM

## 2020-04-02 DIAGNOSIS — Z3400 Encounter for supervision of normal first pregnancy, unspecified trimester: Secondary | ICD-10-CM

## 2020-04-02 DIAGNOSIS — Z3403 Encounter for supervision of normal first pregnancy, third trimester: Secondary | ICD-10-CM

## 2020-04-02 LAB — URINALYSIS
Bilirubin, UA: NEGATIVE
Glucose, UA: NEGATIVE
Ketones, UA: NEGATIVE
Nitrite, UA: NEGATIVE
Protein,UA: NEGATIVE
Specific Gravity, UA: 1.025 (ref 1.005–1.030)
Urobilinogen, Ur: 0.2 mg/dL (ref 0.2–1.0)
pH, UA: 6.5 (ref 5.0–7.5)

## 2020-04-02 NOTE — Progress Notes (Signed)
   PRENATAL VISIT NOTE  Subjective:  Meghan Peck is a 25 y.o. G1P0000 at [redacted]w[redacted]d being seen today for ongoing prenatal care.  She is currently monitored for the following issues for this low-risk pregnancy and has Supervision of normal first pregnancy, antepartum; Blood type, Rh negative; Uterine fibroid in pregnancy; and Lab test positive for detection of COVID-19 virus 02/26/20 on their problem list.  Patient reports contractions since last night.  Contractions: Not present. Vag. Bleeding: Small.  Movement: Absent. Denies leaking of fluid/ROM.   The following portions of the patient's history were reviewed and updated as appropriate: allergies, current medications, past family history, past medical history, past social history, past surgical history and problem list. Problem list updated.  Objective:   Vitals:   04/02/20 0837  BP: 133/83  Pulse: 72  Temp: (!) 97.1 F (36.2 C)  Weight: 136 lb 12.8 oz (62.1 kg)    Fetal Status: Fetal Heart Rate (bpm): 160 Fundal Height: 37 cm Movement: Absent  Presentation: Vertex  General:  Alert, oriented and cooperative. Patient is in no acute distress.  Skin: Skin is warm and dry. No rash noted.   Cardiovascular: Normal heart rate noted  Respiratory: Normal respiratory effort, no problems with respiration noted  Abdomen: Soft, gravid, appropriate for gestational age.  Pain/Pressure: Absent     Pelvic: Cervical exam performed Dilation: 1 Effacement (%): 50 Station: -3  Extremities: Normal range of motion.  Edema: None  Mental Status: Normal mood and affect. Normal behavior. Normal judgment and thought content.   Assessment and Plan:  Pregnancy: G1P0000 at [redacted]w[redacted]d  1. Encounter for supervision of normal first pregnancy in third trimester [facility] last appt last [place] 133/83 with neg proteinuria; repeat BP=139/87; repeat 129/81 C/o u/c's all night q 10 min and states hasn't felt baby move since early last night (no breakfast  yet). FM seen on exam. NST now=reactive per Dr. Ernestina Patches IOL paperwork completed and pt desires 04/20/20 Has car seat and ready for baby at home Peds--[facility] Not working +Covid 02/26/20  - Urinalysis (Urine Dip)   Term labor symptoms and general obstetric precautions including but not limited to vaginal bleeding, contractions, leaking of fluid and fetal movement were reviewed in detail with the patient. Please refer to After Visit Summary for other counseling recommendations.  Return in about 1 week (around 04/09/2020) for routine PNC.  No future appointments.  Herbie Saxon, CNM

## 2020-04-02 NOTE — Progress Notes (Signed)
NST:  130s/ mod/+accels, no decels Toco quiet  NST is reactive and reassuring for gestational age

## 2020-04-02 NOTE — Progress Notes (Signed)
Patient NST completed and reviewed by Dr. Ernestina Patches before patient taken off NST. Patient repeat BP 129/81, reviewed by provider, E. Sciora, and patient scheduled to return on 04/05/2020 for BP check and follow-up. Patient going to [facility] to check on benefits.Jenetta Downer, RN

## 2020-04-02 NOTE — Progress Notes (Signed)
Presents for [facility] RV at 39.2 weeks. Takes PNV.Denies ED/Hospital visit since last RV. NST today by K. Brewer-Jensen. Rescheduled for RV on 3/17.  Barkley Boards, RN

## 2020-04-03 DIAGNOSIS — Z3403 Encounter for supervision of normal first pregnancy, third trimester: Secondary | ICD-10-CM

## 2020-04-04 ENCOUNTER — Telehealth: Payer: Self-pay

## 2020-04-04 NOTE — Telephone Encounter (Signed)
04/05/20 [facility] follow-up appt was cancelled and has not been rescheduled.   Phone call to pt. Left message on voicemail that RN with [facility] is calling about the 04/05/20 cancelled appt, just calling to reschedule for a later date.  Our number is [phone].

## 2020-04-04 NOTE — Telephone Encounter (Signed)
Pt was calling to return a call from [facility] clinic.

## 2020-04-04 NOTE — Telephone Encounter (Signed)
[facility] IOL referral faxed on 04/02/2020 with fax confirmation received. Call to Lolita Patella [facility] scheduler and left message to call regarding IOL appt. Rich Number, RN

## 2020-04-04 NOTE — Telephone Encounter (Signed)
Call from client reporting she delivered 04/03/2020. Client aware to call for appt if [facility] MD's desire her to have an early post-partum BP check. 04/05/2020 [facility] RV appt cancelled. Left message on voicemail of Patrice ([facility] scheduler) that IOL appt was not needed as delivered. Rich Number, RN

## 2020-04-04 NOTE — Telephone Encounter (Signed)
Phone call back to pt. Pt informed that I just saw a previous phone note stating she had delivered, please ignore the message I just left. Pt states everything is going well.

## 2020-04-05 ENCOUNTER — Ambulatory Visit: Payer: Self-pay

## 2020-05-22 ENCOUNTER — Telehealth: Payer: Self-pay

## 2020-05-22 NOTE — Telephone Encounter (Signed)
TC to patient to schedule PP appointment. Patient delivered 04/03/2020. LM with number to call.Jenetta Downer, RN

## 2020-05-23 ENCOUNTER — Telehealth: Payer: Self-pay

## 2020-05-23 NOTE — Telephone Encounter (Signed)
Erroneous encounter. Rich Number, RN

## 2020-05-23 NOTE — Telephone Encounter (Signed)
Call to client to schedule post-partum appt. Left message to call with number to call provided. Rich Number, RN

## 2020-05-24 NOTE — Telephone Encounter (Signed)
Call to client to schedule post-partum appt and left message to call with number provided. Call to emergency contact and left message requesting assistance contacting client to schedule post-partum appt. Number to call provided. Rich Number, RN

## 2020-06-15 ENCOUNTER — Telehealth: Payer: Self-pay

## 2020-06-15 ENCOUNTER — Ambulatory Visit: Payer: Medicaid Other

## 2020-06-15 NOTE — Telephone Encounter (Signed)
Call to client to reschedule 06/15/2020 pm post-partum appt due to provider availability. Appt rescheduled for 06/21/2020. Rich Number, RN

## 2020-06-21 ENCOUNTER — Ambulatory Visit: Payer: Medicaid Other | Admitting: Family Medicine

## 2020-06-21 ENCOUNTER — Encounter: Payer: Self-pay | Admitting: Family Medicine

## 2020-06-21 ENCOUNTER — Other Ambulatory Visit: Payer: Self-pay

## 2020-06-21 DIAGNOSIS — R009 Unspecified abnormalities of heart beat: Secondary | ICD-10-CM

## 2020-06-21 NOTE — Progress Notes (Signed)
Post Partum Exam  Meghan Peck is a 25 y.o. G15P1001 female who presents for a postpartum visit. She is 11 weeks postpartum following a spontaneous vaginal delivery. I have fully reviewed the prenatal and intrapartum course. The delivery was at [redacted]w[redacted]d gestational weeks.  Anesthesia: none. Postpartum course has been going well . Baby's course has been going well no complications. Baby is feeding by Breast Bleeding no bleeding. Bowel function is normal. Bladder function is normal. Patient is not sexually active. Contraception method is none.   Postpartum depression screening: PhQ-9 score =0    The following portions of the patient's history were reviewed and updated as appropriate: allergies, current medications, past family history, past medical history, past social history, past surgical history and problem list. Last pap smear done 09/27/2019 and was Normal  Review of Systems Pertinent items are noted in HPI.    Objective:  BP 128/82   Ht 5\' 1"  (1.549 m)   Wt 136 lb 9.6 oz (62 kg)   BMI 25.81 kg/m   Gen: some concern and distress HEENT: not assessed  CV: HR varying jumping from 60's to 80's  Lung: having moments of labored breathing  Breast:performed-no  Ext: warm well perfused  GU: deferred  Rectal: performed -  not indicated       Assessment:     postpartum exam. Pap smear not done at today's visit.   Plan:   Essential components of care per ACOG recommendations for Comprehensive Postpartum exam:  1.  Mood and well being: Patient with negative depression screening today. Reviewed local resources for support. EPDS is low risk. Reviewed resources and that mood sx in first year after pregnancy are considered related to pregnancy and to reach out for help at [facility] if needed. Discussed [facility] as link to care and availability of LCSW for counseling  - Patient does not use tobacco.  - hx of drug use? No    2. Infant care and feeding:  -Patient currently breastmilk feeding? Yes  If breastmilk feeding discussed return to work and pumping. If needed, patient was provided letter for work to allow for every 2-3 hr pumping breaks, and to be granted a private location to express breastmilk and refrigerated area to store breastmilk. Reviewed importance of draining breast regularly to support lactation. I  -Recommended patient engage with WIC/BFpeer counselors  -Counseled to sign new child up for [facility] services -Social determinants of health (SDOH) reviewed in EPIC. No concerns. The following needs were identified   3. Sexuality, contraception and birth spacing  Contraception: Contraception counseling: Reviewed all forms of birth control options in the tiered based approach. available including abstinence; over the counter/barrier methods; hormonal contraceptive medication including pill, patch, ring, injection,contraceptive implant; hormonal and nonhormonal IUDs; permanent sterilization options including vasectomy and the various tubal sterilization modalities. Risks, benefits, and typical effectiveness rates were reviewed.  Questions were answered.  Written information was also given to the patient to review.  Patient desires no, Pt is in same sex relationship.   Patient was offered not  ECP.   - Patient does not want a pregnancy in the next year.  Desired family size is 1 children.  - Reviewed forms of contraception in tiered fashion. Patient desired no method today.   - Discussed birth spacing of 18 months  4. Sleep and fatigue -Encouraged family/partner/community support of 4 hrs of uninterrupted sleep to help with mood and fatigue  5. Physical Recovery  - Discussed patients delivery  and complications -  Patient had a 1st  degree laceration, perineal healing reviewed. Patient expressed understanding - Patient has urinary incontinence? No  - Patient is safe to resume physical and sexual activity  6.  Health Maintenance/Chronic Disease  1. Postpartum exam Exam deferred.   Interview and history complete before pt needed to be sent to ER  2. Heart rate problem  - Pt heart rate jumping back and forth between lower60's to 80's - pt reported having papatations and some labored breathing.   -Patient sent to ER in personal vehicle for further evaluations -pt to reschedule for completion of exam when available.     Health Maintenance Due  Topic Date Due  . PAP-Cervical Cytology Screening  Never done    - Last pap smear performed 09/2019  and was normal with negative HPV.  N/A Mammogram    Patient given handout about PCP care in the community Given MVI per family planning program guidelines and availability  Follow up in: 1-2 weeks after ER evaluation  or as needed.    Junious Dresser, FNP

## 2020-06-26 ENCOUNTER — Telehealth: Payer: Self-pay

## 2020-06-26 NOTE — Telephone Encounter (Addendum)
Call to client to reschedule physical component of post-partum exam as unable to complete 06/21/2020 due to ED referral. Hilario Quarry FNP-BC requested client be on her schedule as began appt with client. Client requested appt Monday next week and scheduled on M. Garner's schedule. Rich Number, RN

## 2020-07-02 ENCOUNTER — Ambulatory Visit: Payer: Self-pay

## 2020-10-02 ENCOUNTER — Other Ambulatory Visit: Payer: Medicaid Other

## 2022-03-20 ENCOUNTER — Ambulatory Visit: Payer: Medicaid Other

## 2022-07-13 IMAGING — US US EXTREM LOW VENOUS*L*
1 series · 14 of 24 positions shown · non-contrast
Comparison: None.

CLINICAL DATA: Left lower extremity pain swelling

EXAM:
LEFT LOWER EXTREMITY VENOUS DOPPLER ULTRASOUND
TECHNIQUE: Gray-scale sonography with compression, as well as color and duplex
ultrasound, were performed to evaluate the deep venous system(s)
from the level of the common femoral vein through the popliteal and
proximal calf veins.

[Series 1: us venous img lower uni left (dvt) · portal-venous · 14 of 39 slices shown]
[im 1/39]
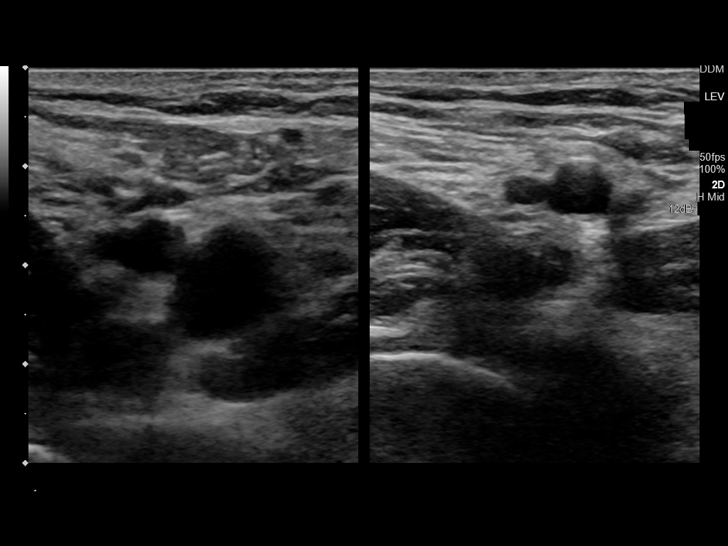
[im 4/39]
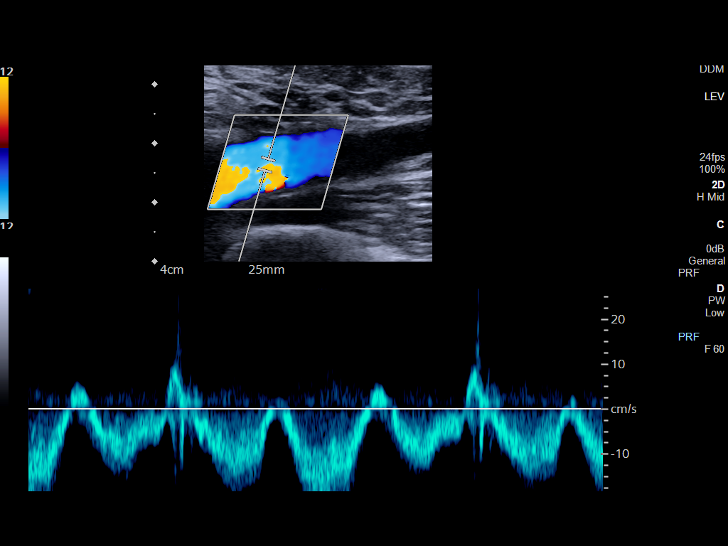
[im 7/39]
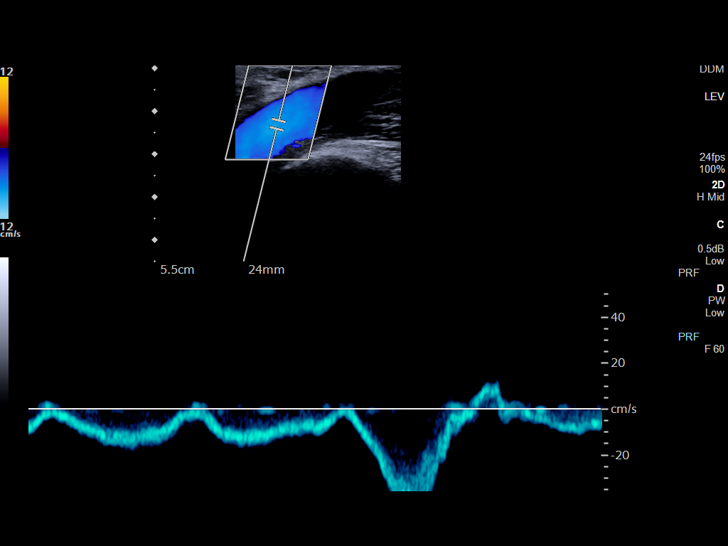
[im 10/39]
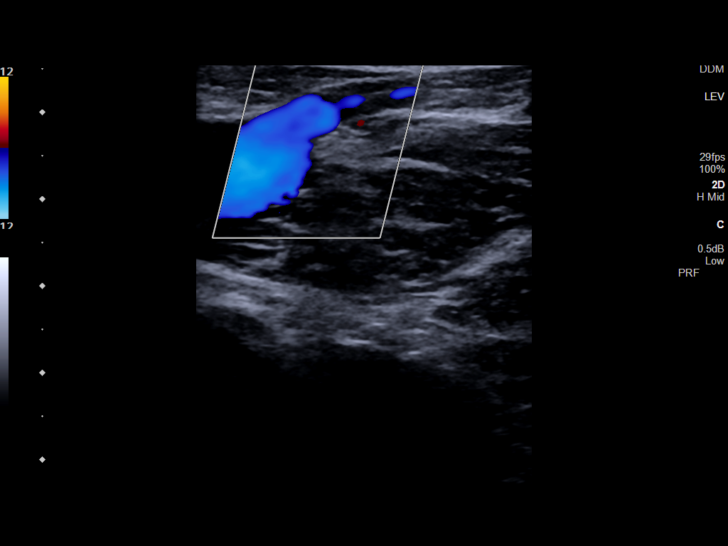
[im 12/39]
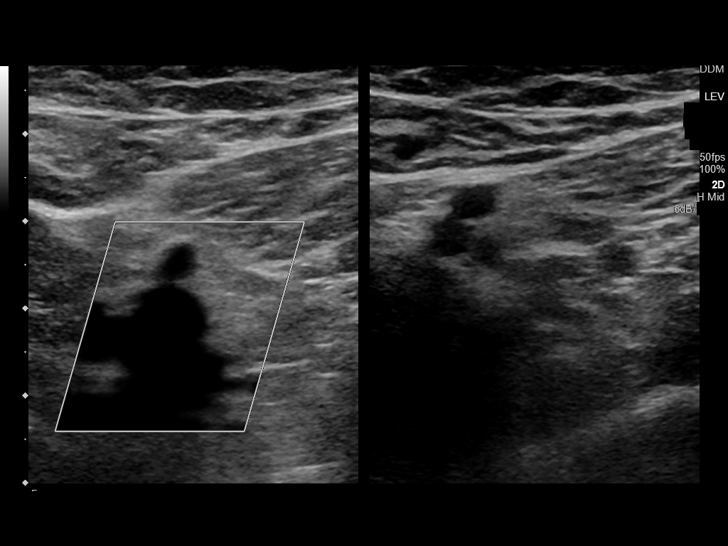
[im 15/39]
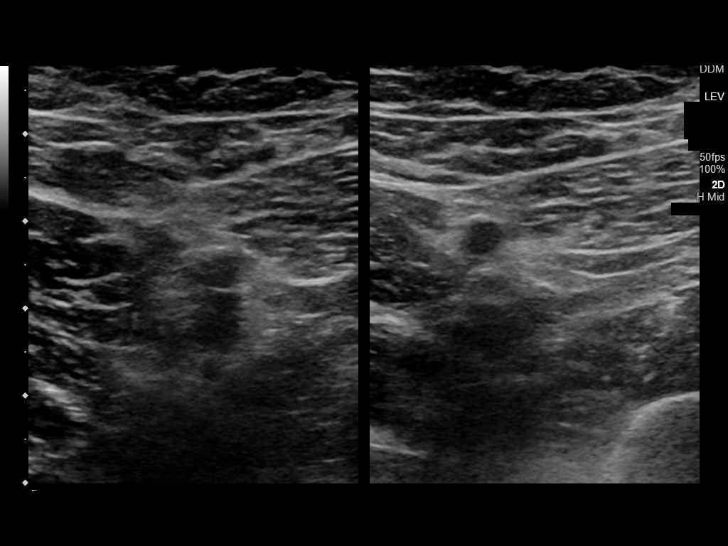
[im 19/39]
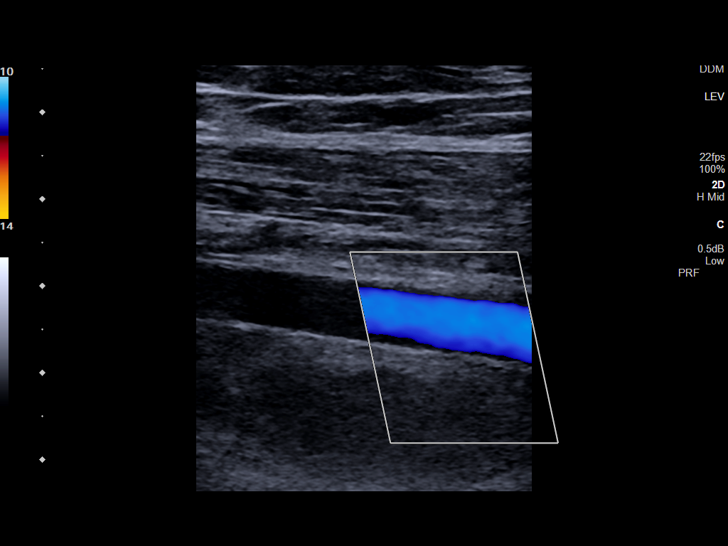
[im 20/39]
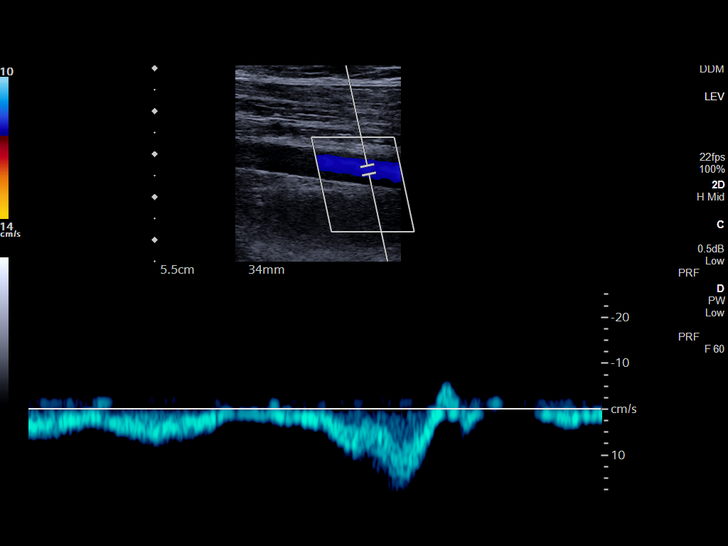
[im 24/39]
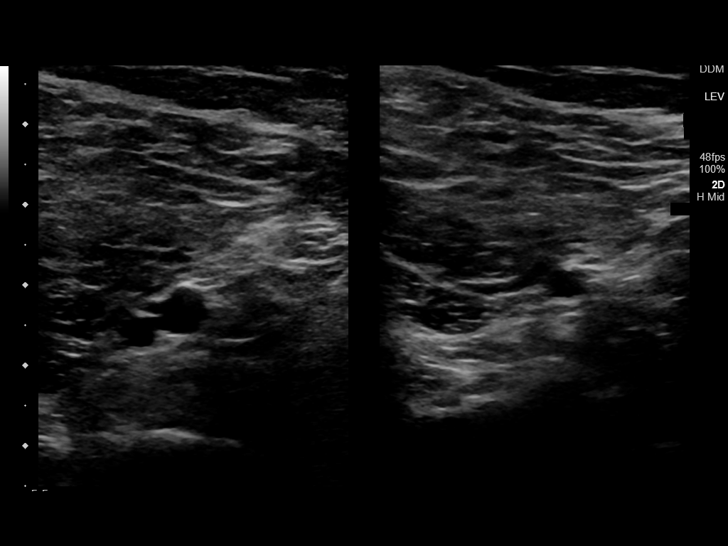
[im 27/39]
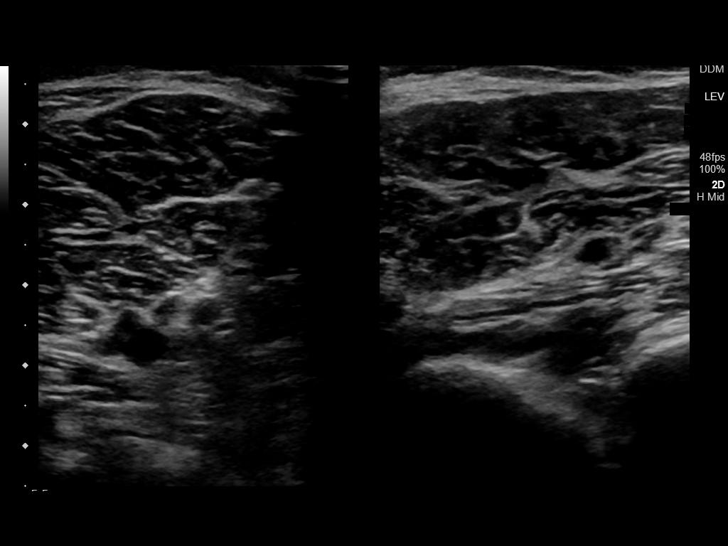
[im 30/39]
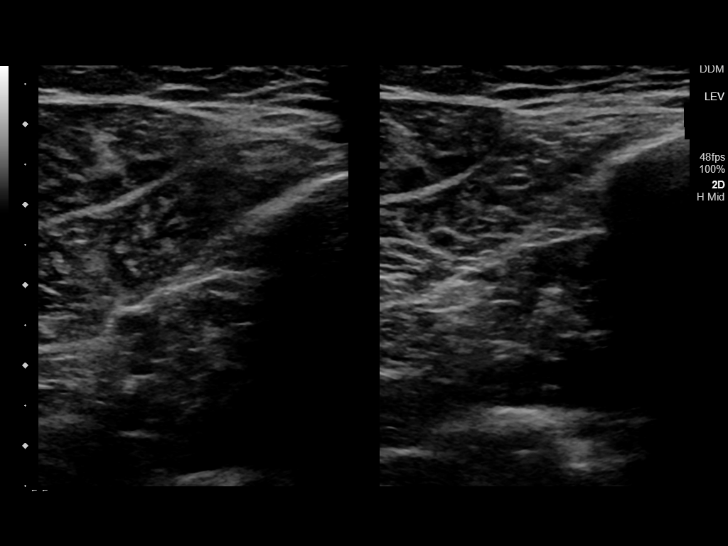
[im 32/39]
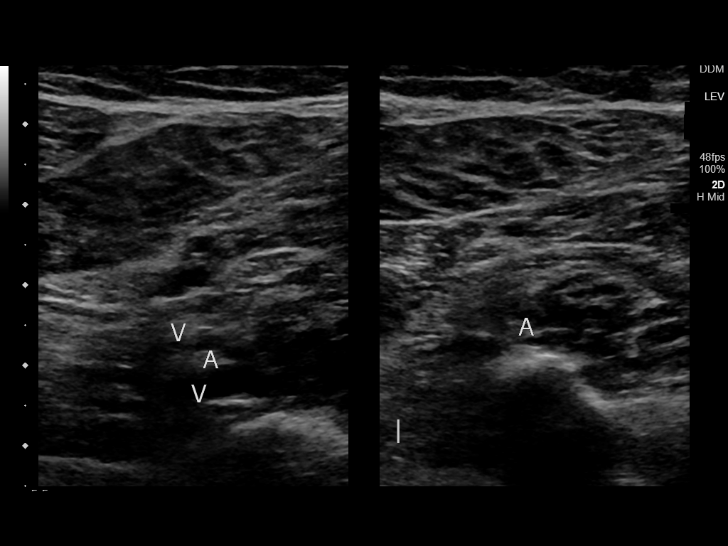
[im 35/39]
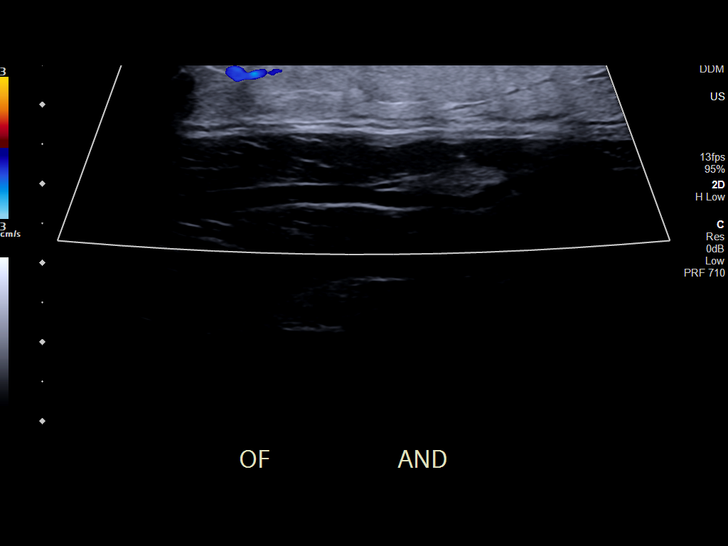
[im 39/39]
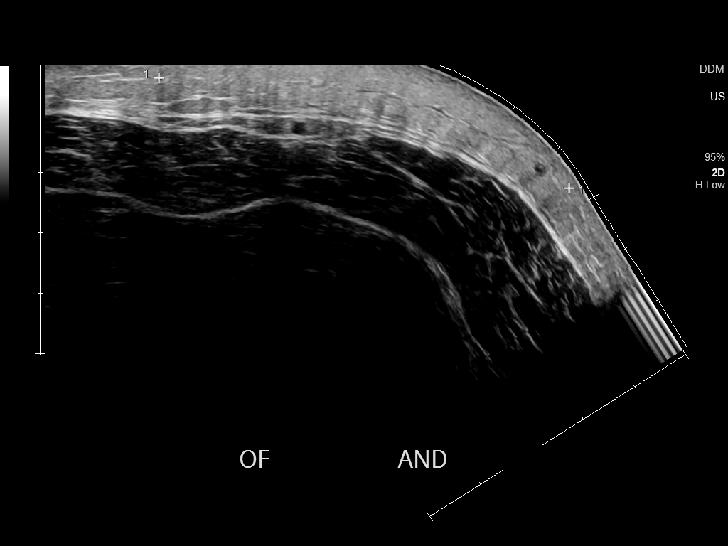

[14 of 24 positions shown; findings below may reference images not displayed]

FINDINGS: VENOUS

Normal compressibility of the common femoral, superficial femoral,
and popliteal veins, as well as the visualized calf veins.
Visualized portions of profunda femoral vein and great saphenous
vein unremarkable. No filling defects to suggest DVT on grayscale or
color Doppler imaging. Doppler waveforms show normal direction of
venous flow, normal respiratory plasticity and response to
augmentation.

Limited views of the contralateral common femoral vein are
unremarkable.

OTHER

Subcutaneous tissue edema at the site of erythema.

Limitations: none
IMPRESSION: No left lower extremity DVT.

## 2023-01-21 NOTE — L&D Delivery Note (Addendum)
 Delivery Note  First Stage: Labor onset: 1650 Augmentation : cytotec  50mcg/50mcg Analgesia /Anesthesia intrapartum: IVPM x 2 SROM at delivery, infant born en caul  Second Stage: Complete dilation at 2124 Onset of pushing at 2124  Delivery of a non-viable female infant 10/13/2023 at 2129 by Edsel Blush, CNM. delivery of fetal head en caul in OA position with restitution to ROA. Loose nuchal cord reduced after delivery;  Anterior then posterior shoulders delivered easily with gentle downward traction. Baby placed on mom's chest, no spontaneous respirations, no heart beat noted, umbilical cord purple and pulseless, no fetal tone, no signs of life.   Cord double clamped after cessation of pulsation, cut by patient's family member Cord blood sample collected   Third Stage: Placenta delivered Keren intact with 3 VC @ 2142 Placenta disposition: sent to pathology Uterine tone firm / bleeding scant  Right periurethral laceration identified  Anesthesia for repair: n/a Repair none needed, hemostatic and approximated Est. Blood Loss (mL): 50ml  Complications: IUFD  Mom to stay in L&D.  Baby to stay with mom as long as she desires in L&D then move to the morgue.  Newborn: Birth Weight: 4lb 12oz Apgar Scores: 0, 0  I counseled the patient regarding the importance of gathering information now that can be helpful in future pregnancies.   The patient had no unusual bleeding prior to delivery and no sign of abruption on gross placental inspection. The patient did not appear to have chorioamnionitis prior to delivery and there was no foul smell noted at delivery. The umbilical cord was normal appearing with no knot or stricture; it was not tightly wrapped around the fetal neck or limbs. The umbilical cord was noted to be a reddened color. The newborn did not have any sign of unusual edema that would suggest hydrops (ie isoimmunization, parvo, etc) as a cause of death.   The newborn had  normal appearing fingers, toes, lips, facial features.  There was no apparent dysmorphism (to the extent it could be evaluated given the stillbirth) and no open body defect such as omphalocele or ONTD.   I informed the patient that placental pathology and newborn autopsy are recommended for all stillbirths.  She agreed to the placental pathology and will decide on the autopsy based on the cost. We discussed and the decision was made to: order newborn karyotype with plan to reflex to microarray if fetal cell growth is not adequate for karyotype

## 2023-03-16 ENCOUNTER — Ambulatory Visit (LOCAL_COMMUNITY_HEALTH_CENTER): Payer: Medicaid Other

## 2023-03-16 VITALS — BP 124/65 | Ht 60.0 in | Wt 121.5 lb

## 2023-03-16 DIAGNOSIS — Z3201 Encounter for pregnancy test, result positive: Secondary | ICD-10-CM | POA: Diagnosis not present

## 2023-03-16 DIAGNOSIS — Z309 Encounter for contraceptive management, unspecified: Secondary | ICD-10-CM

## 2023-03-16 LAB — PREGNANCY, URINE: Preg Test, Ur: POSITIVE — AB

## 2023-03-16 MED ORDER — PRENATAL 27-0.8 MG PO TABS
1.0000 | ORAL_TABLET | Freq: Every day | ORAL | Status: AC
Start: 2023-03-16 — End: 2023-06-24

## 2023-03-16 NOTE — Progress Notes (Signed)
 UPT positive. Plans prenatal care at [facility]. Positive preg packet given and reviewed.  Patient cannot remember LMP, thinks possibly 01/2023, but unsure.  Hx shortness of breath and has had breathing treatments in the past. Last episode months ago. Takes no meds for this.   Consult Dr Lorrin Mais who explains that ultrasound can be ordered to date pregnancy when patient has new OB appt. Would encourage patient to establish PCP and take steps to optimize breathing.  RN discussed provider recommendations. Questions answered and reports understanding.   The patient was dispensed prenatal vitamins #100 today per SO Dr Lorrin Mais. I provided counseling today regarding the medication. We discussed the medication, the side effects and when to call clinic. Patient given the opportunity to ask questions. Questions answered.    Sent to clerk for new OB appt and presumptive elig/medicaid/preg women. Jerel Shepherd, RN

## 2023-03-17 NOTE — Progress Notes (Signed)
 Attestation of Attending Supervision of RN:  I was consulted regarding this patient case and agree with the documentation below.   Fayette Pho, MD Clinical Services Medical Director [facility] Department 03/17/23  9:21 AM

## 2023-04-13 ENCOUNTER — Ambulatory Visit: Payer: Medicaid Other | Admitting: Family Medicine

## 2023-04-13 VITALS — BP 119/69 | HR 82 | Temp 97.3°F | Wt 117.0 lb

## 2023-04-13 DIAGNOSIS — Z3481 Encounter for supervision of other normal pregnancy, first trimester: Secondary | ICD-10-CM | POA: Diagnosis not present

## 2023-04-13 DIAGNOSIS — R112 Nausea with vomiting, unspecified: Secondary | ICD-10-CM

## 2023-04-13 DIAGNOSIS — Z6791 Unspecified blood type, Rh negative: Secondary | ICD-10-CM

## 2023-04-13 DIAGNOSIS — Z3A11 11 weeks gestation of pregnancy: Secondary | ICD-10-CM | POA: Diagnosis not present

## 2023-04-13 DIAGNOSIS — Z3480 Encounter for supervision of other normal pregnancy, unspecified trimester: Secondary | ICD-10-CM | POA: Insufficient documentation

## 2023-04-13 DIAGNOSIS — Z124 Encounter for screening for malignant neoplasm of cervix: Secondary | ICD-10-CM

## 2023-04-13 LAB — HEMOGLOBIN, FINGERSTICK: Hemoglobin: 12.9 g/dL (ref 11.1–15.9)

## 2023-04-13 LAB — WET PREP FOR TRICH, YEAST, CLUE
Trichomonas Exam: NEGATIVE
Yeast Exam: NEGATIVE

## 2023-04-13 MED ORDER — DOXYLAMINE-PYRIDOXINE 10-10 MG PO TBEC: DELAYED_RELEASE_TABLET | ORAL | 2 refills | Status: DC

## 2023-04-13 NOTE — Progress Notes (Signed)
 [facility] [address] [place] [address] Main phone: [phone]  Initial Prenatal Visit  Subjective:  Meghan Peck is a 28 y.o. G2P1001 at [redacted]w[redacted]d being seen today to start prenatal care at the [facility] Department. She is currently monitored for the following issues for this low-risk pregnancy:   Patient Active Problem List   Diagnosis Date Noted   Supervision of other normal pregnancy, antepartum 04/13/2023   Blood type, Rh negative 09/28/2019   Patient reports no complaints.  Contractions: Not present. Vag. Bleeding: None.  Movement: Absent. Denies leaking of fluid.   Symptom review and family concerns Any questions or concerns today: No Nausea or vomiting: Yes Pelvic pain: No Vaginal bleeding: No How do you feel about being pregnant: Feels good Was pregnancy planned: Yes  Dating LMP: 01/21/2023 Reliable? No Any US performed already? No  Past history   Surgical history: None Tobacco: None ETOH: None Drugs: None  Indications for ASA therapy One of the following: Previous pregnancy with preeclampsia, especially early onset and with an adverse outcome No  Multifetal gestation No  Chronic hypertension No  Type 1 or 2 diabetes mellitus No  Chronic kidney disease No  Autoimmune disease (antiphospholipid syndrome, systemic lupus erythematosus) No   Two or more of the following: Nulliparity  No  Obesity (body mass index >30 kg/m2) No  Family history of preeclampsia in mother or sister No  Age >=35 years No  Sociodemographic characteristics (African American race, low socioeconomic level) Yes  Personal risk factors (eg, previous pregnancy with low birth weight or small for gestational age infant, previous adverse pregnancy outcome [eg, stillbirth], interval >10 years between pregnancies) No   The following portions of the patient's history were reviewed and updated as appropriate:  allergies, current medications, past family history, past medical history, past social history, past surgical history and problem list. Problem list updated.  Objective:   Vitals:   04/13/23 0922  BP: 119/69  Pulse: 82  Temp: (!) 97.3 F (36.3 C)  Weight: 117 lb (53.1 kg)   Fetal Status: Fetal Heart Rate (bpm): 162 Fundal Height:  (2 fingers above pelvic brim) Movement: Absent     Physical Exam Vitals and nursing note reviewed. Exam conducted with a chaperone present Henriette Combs, Charity fundraiser).  Constitutional:      General: She is not in acute distress.    Appearance: Normal appearance. She is well-developed and normal weight.  HENT:     Head: Normocephalic and atraumatic.     Right Ear: Tympanic membrane, ear canal and external ear normal.     Left Ear: Tympanic membrane, ear canal and external ear normal.     Nose: Nose normal. No congestion or rhinorrhea.     Mouth/Throat:     Lips: Pink.     Mouth: Mucous membranes are moist.     Dentition: Normal dentition. No dental caries.     Pharynx: Oropharynx is clear. Uvula midline.     Comments: Dentition: intact, without obvious cavities Eyes:     General: No scleral icterus.       Right eye: No discharge.        Left eye: No discharge.     Conjunctiva/sclera: Conjunctivae normal.  Neck:     Thyroid: No thyroid mass or thyromegaly.  Cardiovascular:     Rate and Rhythm: Normal rate and regular rhythm.     Pulses: Normal pulses.     Heart sounds: Normal  heart sounds. No murmur heard.    Comments: Extremities are warm and well perfused Pulmonary:     Effort: Pulmonary effort is normal.     Breath sounds: Normal breath sounds.  Chest:     Chest wall: No mass, lacerations, deformity, swelling or tenderness.  Breasts:    Tanner Score is 5.     Breasts are symmetrical.     Right: Normal. No inverted nipple, mass, nipple discharge or skin change.     Left: Normal. No inverted nipple, mass, nipple discharge or skin change.   Abdominal:     General: Abdomen is flat.     Palpations: Abdomen is soft.     Tenderness: There is no abdominal tenderness. There is no guarding or rebound.     Hernia: There is no hernia in the left inguinal area or right inguinal area.     Comments: Gravid   Genitourinary:    General: Normal vulva.     Exam position: Lithotomy position.     Pubic Area: No rash or pubic lice.      Tanner stage (genital): 5.     Labia:        Right: No rash.        Left: No rash.      Urethra: No prolapse or urethral swelling.     Vagina: Normal. No vaginal discharge.     Cervix: Normal. No cervical motion tenderness or friability.     Uterus: Normal. Enlarged (Gravid 12 week size). Not tender.      Adnexa: Right adnexa normal and left adnexa normal.     Rectum: Normal. No external hemorrhoid.  Musculoskeletal:        General: Normal range of motion.     Cervical back: Normal range of motion and neck supple. No rigidity or tenderness.     Right lower leg: No edema.     Left lower leg: No edema.  Lymphadenopathy:     Cervical: No cervical adenopathy.     Lower Body: No right inguinal adenopathy. No left inguinal adenopathy.  Skin:    General: Skin is warm.     Capillary Refill: Capillary refill takes less than 2 seconds.  Neurological:     General: No focal deficit present.     Mental Status: She is alert and oriented to person, place, and time.    Assessment and Plan:  Pregnancy: G2P1001 at [redacted]w[redacted]d  [redacted] weeks gestation of pregnancy  Supervision of other normal pregnancy, antepartum Assessment & Plan: Doing well. BP normal at 119/69. TWG -3 lb (-1.361 kg). Taking prenatal vitamin daily. Does not qualify for aspirin. LMP unsure, will obtain dating [place]. Patient has selected [facility] for delivering partners, however requests US done locally id possible; [place] ordered [facility]. PAP collected today. Next prenatal appointment in 4 weeks .   Discussed or disclosed in AVS: - Breast feeding support through  [facility] breast feeding peer counselor program - Mood resources, including 988, Maternal Mental Health Line - Prenatal classes  Orders: -     WET PREP FOR TRICH, YEAST, CLUE -     Pregnancy, Initial Screen -     MaterniT 21 plus Core, Blood -     US OB LESS THAN 14 WEEKS WITH OB TRANSVAGINAL; Future -     Hemoglobin, fingerstick  Cervical cancer screening -     IGP, rfx Aptima HPV ASCU  Nausea and vomiting, unspecified vomiting type -     Doxylamine-Pyridoxine; (Diclegis(R)) Initial, 2  tablets (doxylamine succinate 10 mg/pyridoxine hydrochloride 10 mg per tablet) orally at bedtime on day 1 and 2; if symptoms persist, take 1 tablet in morning and 2 tablets at bedtime on day 3; if symptoms persist, may increase to MAX 4 tablets per day, administered as 1 tablet in the morning, 1 tablet in mid-afternoon and 2 tablets at bedtime.  Dispense: 60 tablet; Refill: 2  Discussed overview of care and coordination with inpatient delivery practices including [facility],  [facility].   Reviewed Centering pregnancy as standard of care at [facility]. Patient interested, but unsure if she would be able to do it based on her work schedule.   Preterm labor symptoms and general obstetric precautions including but not limited to vaginal bleeding, contractions, leaking of fluid and fetal movement were reviewed in detail with the patient.  Please refer to After Visit Summary for other counseling recommendations.   No follow-ups on file.  Future Appointments  Date Time Provider Department Center  04/20/2023  4:00 PM [facility]-US [facility]-US [facility]-Outpati  05/11/2023  9:00 AM AC-MH PROVIDER AC-MAT None   Clydene Fake, MD

## 2023-04-13 NOTE — Progress Notes (Addendum)
 Initial prenatal visit and states has not been to ED, other provider, or had ultrasound.   Reports LMP 01/21/23 but not completely sure.  Dating ultrasound ordered by Dr. Fayette Pho. Dr. Larita Fife ordered ultrasound to be done at [facility] to avoid pt having to drive to [facility].  Ultrasound scheduled for 04/20/23 at 4 pm with arrival time 3:45 at [facility].  Appt date, time, and printed directions given to pt. Counseled to arrive with full bladder and do not void.  Counseled flu vaccine recommended during pregnancy; pt declined vaccine.  Agreed to [facility] 21 lab today.   Does not desire to know gender.   Cherlynn Polo, RN .  Wet prep negative, hgb 12.9.  No intervention required per SO. Cherlynn Polo, RN   Dr. Larita Fife still needs to sign required provider forms; placed on her desk for signature.  Cherlynn Polo, RN

## 2023-04-13 NOTE — Assessment & Plan Note (Addendum)
 Doing well. BP normal at 119/69. TWG -3 lb (-1.361 kg). Taking prenatal vitamin daily. Does not qualify for aspirin. LMP unsure, will obtain dating [place]. Patient has selected [facility] for delivering partners, however requests US done locally id possible; [place] ordered for [facility]. PAP collected today. Next prenatal appointment in 4 weeks .   Discussed or disclosed in AVS: - Breast feeding support through [facility] breast feeding peer counselor program - Mood resources, including 988, Maternal Mental Health Line - Prenatal classes

## 2023-04-13 NOTE — Patient Instructions (Signed)
 Pregnancy Continue taking your prenatal vitamin daily.  Please schedule your next prenatal visit for about 4 weeks  from now.  Please visit WIC (women's, infants, and children program) to see about their breast feeding peer support program. You can start this program to get tips and tricks for successful breast feeding of your baby.   Prenatal Classes If delivering at [facility] with [facility] or [facility]: Go to [email or web address]   If delivering at [facility]: Go to https://www.[facility].org/ and search for: "Pregnancy and Parenting Classes" "Prepared Childbirth Classes"  Resource regarding exposures that could affect pregnancy: Mother To Pecola Le[employer] is a Dentist with lots of information on the effects of many medications and exposures on your pregnancy. It is free to use, including their web site, phone line, text service, app, or email and live chat.  http://golden-thomas.org/ Email or live chat: MotherToBaby.org Phone: [phone] Text: (413)498-4144 App: search "[employer]"   Maternal Mental Health If you start to develop the below symptoms of depression, please reach out to [place] for an appointment. There is also a Biomedical scientist Health Hotline at [phone]). This hotline has trained counselors, doulas, and midwifes to real-time support, information, and resources.  Feeling sad or hopeless most of the time Lack of interest in things you used to enjoy Less interest in caring for yourself (dressing, fixing hair) Trouble concentrating Trouble coping with daily tasks Constant worry about your baby Sleeping or eating too much or too little Feeling very anxious or nervous Unexplained irritability or anger Unwanted or scary thoughts Feeling that you are not a good mother Thoughts of hurting yourself or your baby  If you feel you are experiencing a mental health crisis,  please reach out to the National Suicide Prevention Hotline at 1-800-273-TALK 708-393-9325).

## 2023-04-16 LAB — PREGNANCY, INITIAL SCREEN
Antibody Screen: NEGATIVE
Basophils Absolute: 0 10*3/uL (ref 0.0–0.2)
Basos: 1 %
Bilirubin, UA: NEGATIVE
Chlamydia trachomatis, NAA: NEGATIVE
EOS (ABSOLUTE): 0.1 10*3/uL (ref 0.0–0.4)
Eos: 1 %
Glucose, UA: NEGATIVE
HCV Ab: NONREACTIVE
HIV Screen 4th Generation wRfx: NONREACTIVE
Hematocrit: 41.3 % (ref 34.0–46.6)
Hemoglobin: 13.3 g/dL (ref 11.1–15.9)
Hepatitis B Surface Ag: NEGATIVE
Immature Grans (Abs): 0 10*3/uL (ref 0.0–0.1)
Immature Granulocytes: 0 %
Ketones, UA: NEGATIVE
Leukocytes,UA: NEGATIVE
Lymphocytes Absolute: 1.5 10*3/uL (ref 0.7–3.1)
Lymphs: 33 %
MCH: 27.7 pg (ref 26.6–33.0)
MCHC: 32.2 g/dL (ref 31.5–35.7)
MCV: 86 fL (ref 79–97)
Monocytes Absolute: 0.3 10*3/uL (ref 0.1–0.9)
Monocytes: 7 %
Neisseria Gonorrhoeae by PCR: NEGATIVE
Neutrophils Absolute: 2.7 10*3/uL (ref 1.4–7.0)
Neutrophils: 58 %
Nitrite, UA: NEGATIVE
Platelets: 299 10*3/uL (ref 150–450)
Protein,UA: NEGATIVE
RBC, UA: NEGATIVE
RBC: 4.8 x10E6/uL (ref 3.77–5.28)
RDW: 12.8 % (ref 11.7–15.4)
RPR Ser Ql: NONREACTIVE
Rh Factor: NEGATIVE
Rubella Antibodies, IGG: 3.15 {index} (ref >0.99)
Specific Gravity, UA: 1.026 (ref 1.005–1.030)
Urobilinogen, Ur: 0.2 mg/dL (ref 0.2–1.0)
WBC: 4.6 10*3/uL (ref 3.4–10.8)
pH, UA: 6 (ref 5.0–7.5)

## 2023-04-16 LAB — IGP, RFX APTIMA HPV ASCU: PAP Smear Comment: 0

## 2023-04-16 LAB — MICROSCOPIC EXAMINATION
Casts: NONE SEEN /LPF
RBC, Urine: NONE SEEN /HPF (ref 0–2)
WBC, UA: NONE SEEN /HPF (ref 0–5)

## 2023-04-16 LAB — HCV INTERPRETATION

## 2023-04-16 LAB — URINE CULTURE, OB REFLEX

## 2023-04-17 ENCOUNTER — Encounter: Payer: Self-pay | Admitting: Family Medicine

## 2023-04-17 ENCOUNTER — Other Ambulatory Visit: Payer: Self-pay | Admitting: Family Medicine

## 2023-04-17 DIAGNOSIS — R8271 Bacteriuria: Secondary | ICD-10-CM | POA: Insufficient documentation

## 2023-04-17 LAB — MATERNIT 21 PLUS CORE, BLOOD
Fetal Fraction: 23
Result (T21): NEGATIVE
Trisomy 13 (Patau syndrome): NEGATIVE
Trisomy 18 (Edwards syndrome): NEGATIVE
Trisomy 21 (Down syndrome): NEGATIVE

## 2023-04-17 MED ORDER — CEPHALEXIN 500 MG PO CAPS: 500.0000 mg | ORAL_CAPSULE | Freq: Three times a day (TID) | ORAL | 0 refills | Status: AC

## 2023-04-20 ENCOUNTER — Ambulatory Visit
Admission: RE | Admit: 2023-04-20 | Discharge: 2023-04-20 | Disposition: A | Discharge status: Home / Self Care | Payer: Self-pay | Source: Ambulatory Visit | Attending: Family Medicine | Admitting: Family Medicine

## 2023-04-20 ENCOUNTER — Telehealth: Payer: Self-pay

## 2023-04-20 ENCOUNTER — Other Ambulatory Visit: Payer: Self-pay | Admitting: Family Medicine

## 2023-04-20 DIAGNOSIS — R112 Nausea with vomiting, unspecified: Secondary | ICD-10-CM

## 2023-04-20 DIAGNOSIS — O3481 Maternal care for other abnormalities of pelvic organs, first trimester: Secondary | ICD-10-CM | POA: Diagnosis not present

## 2023-04-20 DIAGNOSIS — N8312 Corpus luteum cyst of left ovary: Secondary | ICD-10-CM | POA: Diagnosis not present

## 2023-04-20 DIAGNOSIS — Z124 Encounter for screening for malignant neoplasm of cervix: Secondary | ICD-10-CM

## 2023-04-20 DIAGNOSIS — Z3480 Encounter for supervision of other normal pregnancy, unspecified trimester: Secondary | ICD-10-CM | POA: Insufficient documentation

## 2023-04-20 DIAGNOSIS — Z3687 Encounter for antenatal screening for uncertain dates: Secondary | ICD-10-CM | POA: Insufficient documentation

## 2023-04-20 DIAGNOSIS — Z3A13 13 weeks gestation of pregnancy: Secondary | ICD-10-CM | POA: Diagnosis not present

## 2023-04-20 DIAGNOSIS — Z6791 Unspecified blood type, Rh negative: Secondary | ICD-10-CM

## 2023-04-20 DIAGNOSIS — Z3A11 11 weeks gestation of pregnancy: Secondary | ICD-10-CM

## 2023-04-20 NOTE — Telephone Encounter (Signed)
 [place] - refer to earlier phone encounter. Jossie Ng, RN

## 2023-04-20 NOTE — Progress Notes (Addendum)
 Call to client to verify UTI antibiotic picked up and to schedule 1 - 2 week urine TOC appt between 05/04/23 - 05/11/23. Left message to call with number to call provided. Jossie Ng, RN Call to [facility] pharmacy and verified client picked up her UTI antibiotic on 04/19/23. Call to client who states completed all of her prescription. Counseled we will order another urine culture at her 05/11/23 [facility] RV appt. Client has anatomy US scheduled for 06/24/23 at 1300 at [facility]). Jossie Ng, RN

## 2023-04-20 NOTE — Telephone Encounter (Signed)
 Call to client at ~ 0810 04/20/23 to verify she has picked up UTI antibiotic (Dr. Larita Fife sent her a message 04/17/23) and to schedule a 1 - 2 week post antibiotic completion urine TOC which would be between 05/04/23 - 05/11/23. Left message to call with number to call provided. Jossie Ng, RN

## 2023-04-21 ENCOUNTER — Telehealth: Payer: Self-pay

## 2023-04-21 ENCOUNTER — Other Ambulatory Visit: Payer: Self-pay | Admitting: Family Medicine

## 2023-04-21 DIAGNOSIS — O3412 Maternal care for benign tumor of corpus uteri, second trimester: Secondary | ICD-10-CM

## 2023-04-21 DIAGNOSIS — D259 Leiomyoma of uterus, unspecified: Secondary | ICD-10-CM | POA: Insufficient documentation

## 2023-04-21 NOTE — Progress Notes (Unsigned)
 Reviewed dating [place]. Because of

## 2023-04-21 NOTE — Telephone Encounter (Signed)
 Call pt re pap results from 04/13/23 specimen and colpo referral. LSIL, HPV test not performed.  Confirm insurance status and where pt would like to go for colpo.

## 2023-04-21 NOTE — Telephone Encounter (Signed)
 Phone call to pt at [phone]. Left message stating RN with [facility] is calling re pap smear results. Please call Meghan Peck at [phone].

## 2023-04-21 NOTE — Progress Notes (Signed)
 Dating [place] reviewed. Since viable IUP dated [redacted]w[redacted]d. Will update dating tab and order anatomy US for proper timing.   Fayette Pho, MD 04/21/23  5:41 PM

## 2023-04-23 NOTE — Telephone Encounter (Signed)
 Phone call to pt at [phone]. Left message stating RN with [facility] is calling re pap smear results. Please call Jaysean Manville at [phone].

## 2023-04-28 NOTE — Telephone Encounter (Addendum)
 Phone call to pt at [phone]. Pt answered and confirmed identity. Counseled pt re pap results and colpo referral.  Pt confirmed she has Medicaid insurance and referral to be sent to [facility] referral to be sent to [facility] after delivery. EDD 10/28/23.

## 2023-05-11 ENCOUNTER — Ambulatory Visit: Admitting: Family Medicine

## 2023-05-11 ENCOUNTER — Encounter: Payer: Self-pay | Admitting: Family Medicine

## 2023-05-11 VITALS — BP 127/64 | HR 89 | Temp 98.3°F | Wt 121.4 lb

## 2023-05-11 DIAGNOSIS — R8271 Bacteriuria: Secondary | ICD-10-CM

## 2023-05-11 DIAGNOSIS — R87612 Low grade squamous intraepithelial lesion on cytologic smear of cervix (LGSIL): Secondary | ICD-10-CM

## 2023-05-11 DIAGNOSIS — Z3482 Encounter for supervision of other normal pregnancy, second trimester: Secondary | ICD-10-CM

## 2023-05-11 DIAGNOSIS — Z3480 Encounter for supervision of other normal pregnancy, unspecified trimester: Secondary | ICD-10-CM

## 2023-05-11 DIAGNOSIS — Z3A15 15 weeks gestation of pregnancy: Secondary | ICD-10-CM

## 2023-05-11 NOTE — Progress Notes (Signed)
 Here today for 15.5 week MH RV. Taking PNV every day. Denies ED/hospital visits since last RV. Has completed UTI medication. Urine C&S today. Declines AFP. Eden Goodpasture, RN

## 2023-05-11 NOTE — Progress Notes (Signed)
  [facility] [address] [place] [address] Main phone: [phone]  Prenatal Visit  Subjective:  Meghan Peck is a 28 y.o. G2P1001 at [redacted]w[redacted]d being seen today for ongoing prenatal care.  She is currently monitored for the following issues for this low-risk pregnancy:   Patient Active Problem List   Diagnosis Date Noted   Low grade squamous intraepithelial lesion on cytologic smear of cervix (LGSIL) 05/11/2023   Uterine fibroids affecting pregnancy 04/21/2023   GBS bacteriuria 04/17/2023   Supervision of other normal pregnancy, antepartum 04/13/2023   Blood type, Rh negative 09/28/2019   Patient reports no complaints.  Contractions: Not present. Vag. Bleeding: None.  Movement: Absent. Denies leaking of fluid/ROM.   The following portions of the patient's history were reviewed and updated as appropriate: allergies, current medications, past family history, past medical history, past social history, past surgical history and problem list. Problem list updated.  Objective:   Vitals:   05/11/23 0929  BP: 127/64  Pulse: 89  Temp: 98.3 F (36.8 C)  Weight: 121 lb 6.4 oz (55.1 kg)    Fetal Status: Fetal Heart Rate (bpm): 136 Fundal Height: 15 cm Movement: Absent     General:  Alert, oriented and cooperative. Patient is in no acute distress.  Skin: Skin is warm and dry. No rash noted.   Cardiovascular: Normal heart rate noted  Respiratory: Normal respiratory effort, no problems with respiration noted  Abdomen: Soft, gravid, appropriate for gestational age.  Pain/Pressure: Absent     Pelvic: Cervical exam deferred        Extremities: Normal range of motion.  Edema: None  Mental Status: Normal mood and affect. Normal behavior. Normal judgment and thought content.   Assessment and Plan:  Pregnancy: G2P1001 at [redacted]w[redacted]d  1. [redacted] weeks gestation of pregnancy (Primary) -has US  appt scheduled for 06/24/23 at [facility]  2.  Supervision of other normal pregnancy, antepartum -taking PNV daily 1 lb 6.4 oz (0.635 kg) -exercise- no formal exercise- active at work  - Urine Culture  3. GBS bacteriuria -treated and took all meds  -TOC today  - Urine Culture  4. Low grade squamous intraepithelial lesion on cytologic smear of cervix (LGSIL) Counseled regarding need for colpo pp -will set up with RN   Preterm labor symptoms and general obstetric precautions including but not limited to vaginal bleeding, contractions, leaking of fluid and fetal movement were reviewed in detail with the patient. Please refer to After Visit Summary for other counseling recommendations.  Return in about 4 weeks (around 06/08/2023) for Routine Prenatal Care.  No future appointments.  Earleen Glazier, [place]

## 2023-05-13 LAB — URINE CULTURE: Organism ID, Bacteria: NO GROWTH

## 2023-06-08 ENCOUNTER — Ambulatory Visit: Admitting: Family Medicine

## 2023-06-08 ENCOUNTER — Encounter: Payer: Self-pay | Admitting: Family Medicine

## 2023-06-08 VITALS — BP 128/70 | HR 89 | Temp 97.8°F | Wt 129.8 lb

## 2023-06-08 DIAGNOSIS — N760 Acute vaginitis: Secondary | ICD-10-CM

## 2023-06-08 DIAGNOSIS — Z3A19 19 weeks gestation of pregnancy: Secondary | ICD-10-CM

## 2023-06-08 DIAGNOSIS — Z3480 Encounter for supervision of other normal pregnancy, unspecified trimester: Secondary | ICD-10-CM

## 2023-06-08 DIAGNOSIS — Z3482 Encounter for supervision of other normal pregnancy, second trimester: Secondary | ICD-10-CM

## 2023-06-08 DIAGNOSIS — B9689 Other specified bacterial agents as the cause of diseases classified elsewhere: Secondary | ICD-10-CM

## 2023-06-08 DIAGNOSIS — R8271 Bacteriuria: Secondary | ICD-10-CM

## 2023-06-08 LAB — WET PREP FOR TRICH, YEAST, CLUE
Clue Cell Exam: NEGATIVE
Trichomonas Exam: NEGATIVE
Yeast Exam: NEGATIVE

## 2023-06-08 MED ORDER — METRONIDAZOLE 500 MG PO TABS: 500.0000 mg | ORAL_TABLET | Freq: Two times a day (BID) | ORAL | Status: AC

## 2023-06-08 NOTE — Progress Notes (Addendum)
 Here today for 19.5 week MH RV. Taking PNV 3-4 times weekly "when I can remember." Declines AFP today. Denies ED/hospital visits since last RV. Complains of thick white "smelly" vaginal discharge. Eden Goodpasture, RN  Dispensed Metronidazole  500 mg #14 per verbal order of Fain Home, FNP. Eden Goodpasture, RN

## 2023-06-08 NOTE — Progress Notes (Signed)
  [facility] [address] [place] [address] Main phone: [phone]  Prenatal Visit  Subjective:  Meghan Peck is a 28 y.o. G2P1001 at [redacted]w[redacted]d being seen today for ongoing prenatal care.  She is currently monitored for the following issues for this low-risk pregnancy:   Patient Active Problem List   Diagnosis Date Noted   Low grade squamous intraepithelial lesion on cytologic smear of cervix (LGSIL) 05/11/2023   Uterine fibroids affecting pregnancy 04/21/2023   GBS bacteriuria 04/17/2023   Supervision of other normal pregnancy, antepartum 04/13/2023   Blood type, Rh negative 09/28/2019   Patient reports white vaginal discharge.  Contractions: Not present. Vag. Bleeding: None.  Movement: Absent. Denies leaking of fluid/ROM.   The following portions of the patient's history were reviewed and updated as appropriate: allergies, current medications, past family history, past medical history, past social history, past surgical history and problem list. Problem list updated.  Objective:   Vitals:   06/08/23 0917  BP: 128/70  Pulse: 89  Temp: 97.8 F (36.6 C)  Weight: 129 lb 12.8 oz (58.9 kg)    Fetal Status: Fetal Heart Rate (bpm): 150 Fundal Height: 20 cm Movement: Absent     General:  Alert, oriented and cooperative. Patient is in no acute distress.  Skin: Skin is warm and dry. No rash noted.   Cardiovascular: Normal heart rate noted  Respiratory: Normal respiratory effort, no problems with respiration noted  Abdomen: Soft, gravid, appropriate for gestational age.  Pain/Pressure: Absent     Pelvic: Cervical exam deferred        Extremities: Normal range of motion.  Edema: None  Mental Status: Normal mood and affect. Normal behavior. Normal judgment and thought content.   Assessment and Plan:  Pregnancy: G2P1001 at [redacted]w[redacted]d  1. [redacted] weeks gestation of pregnancy (Primary) -anatomy US  scheduled for  6/4 -pt reports concerns for not feeling baby move yet- counseled that this can be normal at this point in pregnancy, and it is possible placenta is more anteriorly located, masking baby movements   2. Supervision of other normal pregnancy, antepartum -taking PNV daily 9 lb 12.8 oz (4.445 kg) -reports white vaginal discharge- wet prep done today=  amine pos, negative for clue cells. Pt self swabbed- strongly desired tx today -tx for BV  3. GBS bacteriuria -TOC neg on 4/21   Preterm labor symptoms and general obstetric precautions including but not limited to vaginal bleeding, contractions, leaking of fluid and fetal movement were reviewed in detail with the patient. Please refer to After Visit Summary for other counseling recommendations.  Return in about 4 weeks (around 07/06/2023) for Routine Prenatal Care.  No future appointments.   Earleen Glazier, [place]

## 2023-06-10 ENCOUNTER — Ambulatory Visit: Payer: Self-pay

## 2023-06-30 ENCOUNTER — Encounter: Payer: Self-pay | Admitting: Family Medicine

## 2023-07-05 NOTE — Progress Notes (Unsigned)
  [facility] [address] [place] [address] Main phone: [phone]  Prenatal Visit  Subjective:  Meghan Peck is a 28 y.o. G2P1001 at [redacted]w[redacted]d being seen today for ongoing prenatal care.  She is currently monitored for the following issues for this low-risk pregnancy:   Patient Active Problem List   Diagnosis Date Noted   Low grade squamous intraepithelial lesion on cytologic smear of cervix (LGSIL) 05/11/2023   Uterine fibroids affecting pregnancy 04/21/2023   GBS bacteriuria 04/17/2023   Supervision of other normal pregnancy, antepartum 04/13/2023   Blood type, Rh negative 09/28/2019   Patient reports {sx:14538}.   .  .   . ***Denies leaking of fluid/ROM.   The following portions of the patient's history were reviewed and updated as appropriate: allergies, current medications, past family history, past medical history, past social history, past surgical history and problem list. Problem list updated.  Objective:   There were no vitals filed for this visit.  Fetal Status:           General:  Alert, oriented and cooperative. Patient is in no acute distress.  Skin: Skin is warm and dry. No rash noted.   Cardiovascular: Normal heart rate noted  Respiratory: Normal respiratory effort, no problems with respiration noted  Abdomen: Soft, gravid, appropriate for gestational age.        Pelvic: Cervical exam deferred        Extremities: Normal range of motion.     Mental Status: Normal mood and affect. Normal behavior. Normal judgment and thought content.   Assessment and Plan:  Pregnancy: G2P1001 at [redacted]w[redacted]d  1. [redacted] weeks gestation of pregnancy (Primary) Patient will return in 4 weeks for 28 week prenatal appointment.  Plan for 28 week labs and rhogam administration at next visit.  Plan to provide pre-e education with handout at next visit.   2. Supervision of other normal pregnancy,  antepartum ------------ concerns today.  Reports taking PNV daily.  BP ---------, reassuring today.  Total Weight Gain:  *** lb {jigainloss:32889} in previous month  Pre-gravid BMI *** {jiweightcategory:32891}. Expected weight gain this pregnancy *** pounds.  Since last visit, patient had anatomy US  on 06/24/23. According to report anatomy WNL, 3VC, AFV WNL, EFW 57%, and anterior placenta. Recommended follow-up as clinically indicated. Patient made aware of results.   Preterm labor symptoms and general obstetric precautions including but not limited to vaginal bleeding, contractions, leaking of fluid and fetal movement were reviewed in detail with the patient. Please refer to After Visit Summary for other counseling recommendations.  No follow-ups on file.  Future Appointments  Date Time Provider Department Center  07/06/2023  9:00 AM [facility] PROVIDER [facility] None    Merleen Stare, NP

## 2023-07-06 ENCOUNTER — Telehealth: Payer: Self-pay

## 2023-07-06 ENCOUNTER — Ambulatory Visit

## 2023-07-06 DIAGNOSIS — Z3480 Encounter for supervision of other normal pregnancy, unspecified trimester: Secondary | ICD-10-CM

## 2023-07-06 DIAGNOSIS — Z3A24 24 weeks gestation of pregnancy: Secondary | ICD-10-CM

## 2023-07-06 NOTE — Telephone Encounter (Signed)
 [facility] RV appt 07/06/23 cancelled and not rescheduled. Call to client and left message requesting she call and reschedule appt. Number to call provided. Ariel Begun, RN

## 2023-07-07 NOTE — Telephone Encounter (Signed)
 Per [facility] appt desk, client has 07/13/23 [facility] RV appt scheduled. Ariel Begun, RN

## 2023-07-13 ENCOUNTER — Ambulatory Visit: Admitting: Nurse Practitioner

## 2023-07-13 VITALS — BP 117/69 | HR 82 | Temp 97.2°F | Wt 132.2 lb

## 2023-07-13 DIAGNOSIS — Z3482 Encounter for supervision of other normal pregnancy, second trimester: Secondary | ICD-10-CM

## 2023-07-13 DIAGNOSIS — Z3480 Encounter for supervision of other normal pregnancy, unspecified trimester: Secondary | ICD-10-CM

## 2023-07-13 DIAGNOSIS — B9689 Other specified bacterial agents as the cause of diseases classified elsewhere: Secondary | ICD-10-CM

## 2023-07-13 DIAGNOSIS — Z3A25 25 weeks gestation of pregnancy: Secondary | ICD-10-CM

## 2023-07-13 DIAGNOSIS — N760 Acute vaginitis: Secondary | ICD-10-CM

## 2023-07-13 LAB — WET PREP FOR TRICH, YEAST, CLUE
Trichomonas Exam: NEGATIVE
Yeast Exam: NEGATIVE

## 2023-07-13 MED ORDER — METRONIDAZOLE 500 MG PO TABS: 500.0000 mg | ORAL_TABLET | Freq: Two times a day (BID) | ORAL | 0 refills | Status: AC

## 2023-07-13 NOTE — Progress Notes (Signed)
 Patient given [employer]. Discussed [facility] Entrance. In house lab results reviewed during visit.   The patient was dispensed Metronidazole  today. I provided counseling today regarding the medication. We discussed the medication, the side effects and when to call clinic. Patient given the opportunity to ask questions. Questions answered.  Consuelo Kingsley RN

## 2023-07-13 NOTE — Progress Notes (Signed)
  [facility] [address] [place] [address] Main phone: [phone]  Prenatal Visit  Subjective:  Meghan Peck is a 28 y.o. G2P1001 at [redacted]w[redacted]d being seen today for ongoing prenatal care.  She is currently monitored for the following issues for this low-risk pregnancy:   Patient Active Problem List   Diagnosis Date Noted   Low grade squamous intraepithelial lesion on cytologic smear of cervix (LGSIL) 05/11/2023   Uterine fibroids affecting pregnancy 04/21/2023   GBS bacteriuria 04/17/2023   Supervision of other normal pregnancy, antepartum 04/13/2023   Blood type, Rh negative 09/28/2019   Patient reports malodorous vaginal discharge without itching, irritation, lesions, or rash.  Contractions: Not present. Vag. Bleeding: None.  Movement: Present. Denies leaking of fluid/ROM.   The following portions of the patient's history were reviewed and updated as appropriate: allergies, current medications, past family history, past medical history, past social history, past surgical history and problem list. Problem list updated.  Objective:   Vitals:   07/13/23 1119  BP: 117/69  Pulse: 82  Temp: (!) 97.2 F (36.2 C)  Weight: 132 lb 3.2 oz (60 kg)    Fetal Status: Fetal Heart Rate (bpm): 156 Fundal Height: 26 cm Movement: Present     General:  Alert, oriented and cooperative. Patient is in no acute distress.  Skin: Skin is warm and dry. No rash noted.   Cardiovascular: Normal heart rate noted  Respiratory: Normal respiratory effort, no problems with respiration noted  Abdomen: Soft, gravid, appropriate for gestational age.  Pain/Pressure: Absent     Pelvic: Cervical exam deferred        Extremities: Normal range of motion.  Edema: None  Mental Status: Normal mood and affect. Normal behavior. Normal judgment and thought content.   Assessment and Plan:  Pregnancy: G2P1001 at [redacted]w[redacted]d  1. [redacted] weeks  gestation of pregnancy (Primary) RV in 4 weeks.  Plan to collect 28 week labs, including repeat antibody screen, at next visit.  Plan to administer Rhogam at next visit.   2. Supervision of other normal pregnancy, antepartum Reports malodorous vaginal discharge. Obtaining wet prep in-office today and gonorrhea/chlamydia swab.  Reports taking PNV daily.  BP 117/68 in office today and reassuring. Plan to educate regarding Pre-E at next visit.   Total Weight Gain: 12 lb 3.2 oz (5.534 kg)  2.5 lb gain in previous month. Pre-gravid BMI 23.44 normal. Expected weight gain this pregnancy 25-35 pounds.  - WET PREP FOR TRICH, YEAST, CLUE - GC/Chlamydia Probe Amp  3. Bacterial vaginosis Wet prep positive in office for amine and clue cells, with patient reported concern of abnormal discharge, AMSEL criteria 3/4 met. Treating for BV per CDC guidelines.  - metroNIDAZOLE  (FLAGYL ) 500 MG tablet; Take 1 tablet (500 mg total) by mouth 2 (two) times daily for 7 days.  Dispense: 14 tablet; Refill: 0  Preterm labor symptoms and general obstetric precautions including but not limited to vaginal bleeding, contractions, leaking of fluid and fetal movement were reviewed in detail with the patient. Please refer to After Visit Summary for other counseling recommendations.   Return in about 4 weeks (around 08/10/2023) for routine prenatal care.  Future Appointments  Date Time Provider Department Center  08/03/2023  9:00 AM [facility] PROVIDER [facility] None    Clarita LITTIE Narrow, NP

## 2023-07-14 ENCOUNTER — Ambulatory Visit: Payer: Self-pay

## 2023-07-15 LAB — GC/CHLAMYDIA PROBE AMP
Chlamydia trachomatis, NAA: NEGATIVE
Neisseria Gonorrhoeae by PCR: NEGATIVE

## 2023-07-17 NOTE — Progress Notes (Signed)
 GC/Chlamydia swabs negative.   Wet Prep reviewed in office day of visit and treated appropriately based on guidelines.   Clarita L. Inri Sobieski, FNP-C

## 2023-07-27 ENCOUNTER — Ambulatory Visit

## 2023-08-03 ENCOUNTER — Encounter: Payer: Self-pay | Admitting: Family Medicine

## 2023-08-03 ENCOUNTER — Ambulatory Visit: Admitting: Family Medicine

## 2023-08-03 VITALS — BP 126/74 | HR 74 | Temp 97.5°F | Wt 134.8 lb

## 2023-08-03 DIAGNOSIS — Z3480 Encounter for supervision of other normal pregnancy, unspecified trimester: Secondary | ICD-10-CM

## 2023-08-03 DIAGNOSIS — Z3A28 28 weeks gestation of pregnancy: Secondary | ICD-10-CM

## 2023-08-03 DIAGNOSIS — D509 Iron deficiency anemia, unspecified: Secondary | ICD-10-CM | POA: Insufficient documentation

## 2023-08-03 DIAGNOSIS — D649 Anemia, unspecified: Secondary | ICD-10-CM

## 2023-08-03 DIAGNOSIS — Z6791 Unspecified blood type, Rh negative: Secondary | ICD-10-CM | POA: Diagnosis not present

## 2023-08-03 DIAGNOSIS — Z3483 Encounter for supervision of other normal pregnancy, third trimester: Secondary | ICD-10-CM

## 2023-08-03 DIAGNOSIS — Z23 Encounter for immunization: Secondary | ICD-10-CM | POA: Diagnosis not present

## 2023-08-03 LAB — HEMOGLOBIN, FINGERSTICK: Hemoglobin: 10.3 g/dL — ABNORMAL LOW (ref 11.1–15.9)

## 2023-08-03 MED ORDER — RHO D IMMUNE GLOBULIN 1500 UNIT/2ML IJ SOSY
300.0000 ug | PREFILLED_SYRINGE | Freq: Once | INTRAMUSCULAR | Status: AC
  Administered 2023-08-03: 300 ug via INTRAMUSCULAR

## 2023-08-03 MED ORDER — IRON (FERROUS SULFATE) 325 (65 FE) MG PO TABS: 1.0000 | ORAL_TABLET | ORAL | Status: DC

## 2023-08-03 NOTE — Addendum Note (Signed)
 Addended by: OMEGA MILLER A on: 08/03/2023 11:58 AM   Modules accepted: Orders

## 2023-08-03 NOTE — Addendum Note (Signed)
 Addended by: OMEGA MILLER A on: 08/03/2023 05:05 PM   Modules accepted: Orders

## 2023-08-03 NOTE — Patient Instructions (Signed)
 Pregnancy Continue taking your prenatal vitamin daily.  Please schedule your next prenatal visit for about 2 weeks from now.  Please visit WIC (women's, infants, and children program) to see about their breast feeding peer support program. You can start this program to get tips and tricks for successful breast feeding of your baby.   Anemia  Today you hemoglobin showed mild anemia. Hgb 10.3.  Start oral iron  tablets every other day.  We will recheck in 1 month.   Prenatal Classes If delivering at [facility] with [facility] or [place]: Go to [email or web address]   If delivering at [facility]: Go to [email or web address] and search for: Pregnancy and Parenting Classes Prepared Childbirth Classes  Resource regarding exposures that could affect pregnancy: Mother To [facility] is a Dentist with lots of information on the effects of many medications and exposures on your pregnancy. It is free to use, including their web site, phone line, text service, app, or email and live chat.  [email or web address]-[email or web address]/ Email or live chat: MotherToBaby.org Phone: [phone] Text: [phone] App: search [employer]   Maternal Mental Health If you start to develop the below symptoms of depression, please reach out to us  for an appointment. There is also a Biomedical scientist Health Hotline at [phone]). This hotline has trained counselors, doulas, and midwifes to real-time support, information, and resources.  Feeling sad or hopeless most of the time Lack of interest in things you used to enjoy Less interest in caring for yourself (dressing, fixing hair) Trouble concentrating Trouble coping with daily tasks Constant worry about your baby Sleeping or eating too much or too little Feeling very anxious or nervous Unexplained irritability or anger Unwanted or scary thoughts Feeling  that you are not a good mother Thoughts of hurting yourself or your baby  If you feel you are experiencing a mental health crisis, please reach out to the National Suicide Prevention Hotline at 1-800-273-TALK (660) 160-6960).

## 2023-08-03 NOTE — Assessment & Plan Note (Signed)
 Doing well. BP normal. TWG 14 lb 12.8 oz (6.713 kg). Taking prenatal vitamin daily. Next prenatal appointment in 2 weeks.   Discussed or disclosed in AVS: - Breast feeding support through [facility] breast feeding peer counselor program - Mood resources, including 988, Maternal Mental Health Line, and counselor Alan Hail, LCSW, here at [facility] - Prenatal classes - Pain control during delivery: un-medicated delivery - Support person during delivery: FOB Brent - Pre-eclampsia warning signs: Signs and symptoms of preeclampsia were verbally reviewed with warning signs, when and how to call. A written handout with tips on how to take your blood pressure, as well as warning signs was provided to the patient.

## 2023-08-03 NOTE — Progress Notes (Signed)
 In house hgb result reviewed during visit. Patient was dispensed Iron  today. I provided counseling today regarding the medication. We discussed the medication, the side effects and when to call clinic. Patient given the opportunity to ask questions. Questions answered. Anemia in Pregnancy pamphlet given. BTHIELE RN

## 2023-08-03 NOTE — Progress Notes (Signed)
 [facility] [address], [address] [place] [address] Main phone: [phone]  Prenatal Visit  Subjective:  Meghan Peck is a 28 y.o. G2P1001 at [redacted]w[redacted]d being seen today for ongoing prenatal care.  She is currently monitored for the following issues for this low-risk pregnancy:   Patient Active Problem List   Diagnosis Date Noted   Low grade squamous intraepithelial lesion on cytologic smear of cervix (LGSIL) 05/11/2023   Uterine fibroids affecting pregnancy 04/21/2023   GBS bacteriuria 04/17/2023   Supervision of other normal pregnancy, antepartum 04/13/2023   Blood type, Rh negative 09/28/2019   Patient reports no complaints.  Contractions: Not present. Vag. Bleeding: None.  Movement: Present. Denies leaking of fluid/ROM.   The following portions of the patient's history were reviewed and updated as appropriate: allergies, current medications, past family history, past medical history, past social history, past surgical history and problem list. Problem list updated.  Objective:   Vitals:   08/03/23 0932  BP: 126/74  Pulse: 74  Temp: (!) 97.5 F (36.4 C)  Weight: 134 lb 12.8 oz (61.1 kg)   Fetal Status: Fetal Heart Rate (bpm): 140 Fundal Height: 26 cm Movement: Present     General:  Alert, oriented and cooperative. Patient is in no acute distress.  Skin: Skin is warm and dry. No rash noted.   Cardiovascular: Normal heart rate noted  Respiratory: Normal respiratory effort, no problems with respiration noted  Abdomen: Soft, gravid, appropriate for gestational age.  Pain/Pressure: Absent     Pelvic: Cervical exam deferred        Extremities: Normal range of motion.     Mental Status: Normal mood and affect. Normal behavior. Normal judgment and thought content.   Assessment and Plan:  Pregnancy: G2P1001 at [redacted]w[redacted]d  Supervision of other normal pregnancy, antepartum Assessment & Plan: Doing well. BP  normal. TWG 14 lb 12.8 oz (6.713 kg). Taking prenatal vitamin daily. Next prenatal appointment in 2 weeks.   Discussed or disclosed in AVS: - Breast feeding support through [facility] breast feeding peer counselor program - Mood resources, including 988, Maternal Mental Health Line, and counselor Alan Hail, LCSW, here at [facility] - Prenatal classes - Pain control during delivery: un-medicated delivery - Support person during delivery: FOB Brent - Pre-eclampsia warning signs: Signs and symptoms of preeclampsia were verbally reviewed with warning signs, when and how to call. A written handout with tips on how to take your blood pressure, as well as warning signs was provided to the patient.    Orders: -     Rho D Immune Globulin  -     Hemoglobin, fingerstick -     Glucose, 1 hour -     HIV-1/HIV-2 Qualitative RNA -     RPR -     Tdap vaccine greater than or equal to 7yo IM  Blood type, Rh negative -     Rho D Immune Globulin   Anemia, unspecified type -     Iron  (Ferrous Sulfate ); Take 1 tablet by mouth every other day. -     Anemia panel   Preterm labor symptoms and general obstetric precautions including but not limited to vaginal bleeding, contractions, leaking of fluid and fetal movement were reviewed in detail with the patient. Please refer to After Visit Summary for other counseling recommendations.  No follow-ups on file.  Future Appointments  Date Time Provider Department Center  08/24/2023  8:40 AM [facility] PROVIDER [facility] None    Betsey  CHRISTELLA Helling, MD

## 2023-08-03 NOTE — Assessment & Plan Note (Signed)
 Hgb 10.3 on fingerstick today. Will start PO iron  every other day and add on anemia panel to confirm iron  deficiency. Will follow results.

## 2023-08-04 ENCOUNTER — Ambulatory Visit: Payer: Self-pay | Admitting: Family Medicine

## 2023-08-04 LAB — FE+CBC/D/PLT+TIBC+FER+RETIC
Basophils Absolute: 0.1 x10E3/uL (ref 0.0–0.2)
Basos: 1 %
EOS (ABSOLUTE): 0.1 x10E3/uL (ref 0.0–0.4)
Eos: 2 %
Ferritin: 10 ng/mL — ABNORMAL LOW (ref 15–150)
Hematocrit: 32.6 % — ABNORMAL LOW (ref 34.0–46.6)
Hemoglobin: 10.2 g/dL — ABNORMAL LOW (ref 11.1–15.9)
Immature Grans (Abs): 0 x10E3/uL (ref 0.0–0.1)
Immature Granulocytes: 0 %
Iron Saturation: 4 % — CL (ref 15–55)
Iron: 16 ug/dL — ABNORMAL LOW (ref 27–159)
Lymphocytes Absolute: 1.4 x10E3/uL (ref 0.7–3.1)
Lymphs: 23 %
MCH: 26.1 pg — ABNORMAL LOW (ref 26.6–33.0)
MCHC: 31.3 g/dL — ABNORMAL LOW (ref 31.5–35.7)
MCV: 83 fL (ref 79–97)
Monocytes Absolute: 0.4 x10E3/uL (ref 0.1–0.9)
Monocytes: 7 %
Neutrophils Absolute: 4.1 x10E3/uL (ref 1.4–7.0)
Neutrophils: 67 %
Platelets: 279 x10E3/uL (ref 150–450)
RBC: 3.91 x10E6/uL (ref 3.77–5.28)
RDW: 12.5 % (ref 11.7–15.4)
Retic Ct Pct: 1.7 % (ref 0.6–2.6)
Total Iron Binding Capacity: 435 ug/dL (ref 250–450)
UIBC: 419 ug/dL (ref 131–425)
WBC: 6.1 x10E3/uL (ref 3.4–10.8)

## 2023-08-04 NOTE — Progress Notes (Signed)
 Iron  panel confirms iron  deficiency anemia. Patient already on PO iron . Next check in one month.   Dorothyann Helling, MD 08/04/23  9:00 PM

## 2023-08-05 LAB — RPR: RPR Ser Ql: NONREACTIVE

## 2023-08-05 LAB — HIV-1/HIV-2 QUALITATIVE RNA
HIV-1 RNA, Qualitative: NONREACTIVE
HIV-2 RNA, Qualitative: NONREACTIVE

## 2023-08-05 LAB — GLUCOSE, 1 HOUR GESTATIONAL: Gestational Diabetes Screen: 65 mg/dL — ABNORMAL LOW (ref 70–139)

## 2023-08-05 NOTE — Progress Notes (Signed)
 Normal GTT.   Dorothyann Helling, MD 08/05/23  1:48 PM

## 2023-08-07 LAB — ANTIBODY SCREEN: Antibody Screen: NEGATIVE

## 2023-08-07 LAB — SPECIMEN STATUS REPORT

## 2023-08-12 NOTE — Progress Notes (Signed)
 Anemia panel confirms iron  deficiency anemia. Normal GTT, no signs of gestational diabetes. AB screen negative. HIV and syphilis screens negative. Chart updated.   Dorothyann Helling, MD 08/12/23  11:10 AM

## 2023-08-24 ENCOUNTER — Ambulatory Visit: Admitting: Family Medicine

## 2023-08-24 ENCOUNTER — Encounter: Payer: Self-pay | Admitting: Family Medicine

## 2023-08-24 VITALS — BP 120/69 | HR 72 | Temp 97.1°F | Wt 134.2 lb

## 2023-08-24 DIAGNOSIS — R8271 Bacteriuria: Secondary | ICD-10-CM

## 2023-08-24 DIAGNOSIS — Z6791 Unspecified blood type, Rh negative: Secondary | ICD-10-CM

## 2023-08-24 DIAGNOSIS — D509 Iron deficiency anemia, unspecified: Secondary | ICD-10-CM

## 2023-08-24 DIAGNOSIS — Z3A31 31 weeks gestation of pregnancy: Secondary | ICD-10-CM

## 2023-08-24 DIAGNOSIS — O99013 Anemia complicating pregnancy, third trimester: Secondary | ICD-10-CM

## 2023-08-24 DIAGNOSIS — Z3483 Encounter for supervision of other normal pregnancy, third trimester: Secondary | ICD-10-CM

## 2023-08-24 DIAGNOSIS — Z3480 Encounter for supervision of other normal pregnancy, unspecified trimester: Secondary | ICD-10-CM

## 2023-08-24 NOTE — Progress Notes (Signed)
  [facility] [address] [place] [address] Main phone: [phone]  Prenatal Visit  Subjective:  Meghan Peck is a 28 y.o. G2P1001 at [redacted]w[redacted]d being seen today for ongoing prenatal care.  She is currently monitored for the following issues for this low-risk pregnancy:   Patient Active Problem List   Diagnosis Date Noted   Iron  deficiency anemia during pregnancy 08/03/2023   Low grade squamous intraepithelial lesion on cytologic smear of cervix (LGSIL) 05/11/2023   Uterine fibroids affecting pregnancy 04/21/2023   GBS bacteriuria 04/17/2023   Supervision of other normal pregnancy, antepartum 04/13/2023   Blood type, Rh negative 09/28/2019   Patient reports vomiting around 2x daily, nausea, no bleeding, no contractions, no cramping, and no leaking. Feels she is able to stay hydrated. When she vomits, it is mostly mucus.  The following portions of the patient's history were reviewed and updated as appropriate: allergies, current medications, past family history, past medical history, past social history, past surgical history and problem list. Problem list updated.  Objective:   Vitals:   08/24/23 0913  BP: 120/69  Pulse: 72  Temp: (!) 97.1 F (36.2 C)  Weight: 134 lb 3.2 oz (60.9 kg)   Fetal Status: Endorses consistent fetal movements on lateral portions of abdomen, but feels she does not feel quite as much movement due to anterior placenta. No decrease/changes in fetal movements. FHT: 136 BPM, FH 30 cm.  General:  Alert, oriented and cooperative. Patient is in no acute distress.  Skin: Skin is warm and dry. No rash noted.   Cardiovascular: Normal heart rate noted  Respiratory: Normal respiratory effort, no problems with respiration noted  Abdomen: Soft, gravid, appropriate for gestational age.        Pelvic: Cervical exam deferred        Extremities: Normal range of motion.     Mental Status:  Normal mood and affect. Normal behavior. Normal judgment and thought content.   Assessment and Plan:  Pregnancy: G2P1001 at [redacted]w[redacted]d  1. Supervision of other normal pregnancy, antepartum (Primary) - Doing well, no major concerns/complaints today - Reports her mood is good - Still working as a [employer]er, and walks a lot for work. Plans to work for another 4 weeks.  2. [redacted] weeks gestation of pregnancy  3. Blood type, Rh negative - Rhogam given 08/03/23  4. Iron  deficiency anemia during pregnancy -Taking iron  daily, but feels she has had increased nausea with it. Taking at same time as PNV in morning. Vomiting 2x daily, mostly mucus when she vomits. - Advised switching to every other day, taking iron  in evening before bed to try and improve n/v. Discussed taking iron  with some food but avoiding dairy with her iron . Can try taking separately from her PNV. [name] declined need for medication for her n/v. -Recheck hemoglobin at next visit  5. GBS Bacteruria  - No need for GBS swab, presumed positive for delivery  Preterm labor symptoms and general obstetric precautions including but not limited to vaginal bleeding, contractions, leaking of fluid and fetal movement were reviewed in detail with the patient. Please refer to After Visit Summary for other counseling recommendations.   Return in about 2 weeks (around 09/07/2023).  Future Appointments  Date Time Provider Department Center  09/07/2023  9:00 AM [facility] PROVIDER [facility] None    Meghan Peck [facility]

## 2023-09-07 ENCOUNTER — Telehealth: Payer: Self-pay

## 2023-09-07 ENCOUNTER — Ambulatory Visit

## 2023-09-07 NOTE — Addendum Note (Signed)
 Addended by: Ramsey Midgett on: 09/07/2023 01:34 PM   Modules accepted: Orders

## 2023-09-07 NOTE — Telephone Encounter (Signed)
 TC to client who missed her MH RV this morning. LM with number to call to reschedule.Hulan Parish, RN

## 2023-09-08 ENCOUNTER — Ambulatory Visit

## 2023-09-08 VITALS — BP 118/75 | HR 75 | Temp 97.2°F | Wt 138.6 lb

## 2023-09-08 DIAGNOSIS — R8271 Bacteriuria: Secondary | ICD-10-CM

## 2023-09-08 DIAGNOSIS — Z6791 Unspecified blood type, Rh negative: Secondary | ICD-10-CM

## 2023-09-08 DIAGNOSIS — Z3A33 33 weeks gestation of pregnancy: Secondary | ICD-10-CM

## 2023-09-08 DIAGNOSIS — Z3480 Encounter for supervision of other normal pregnancy, unspecified trimester: Secondary | ICD-10-CM

## 2023-09-08 DIAGNOSIS — O99013 Anemia complicating pregnancy, third trimester: Secondary | ICD-10-CM

## 2023-09-08 DIAGNOSIS — Z3483 Encounter for supervision of other normal pregnancy, third trimester: Secondary | ICD-10-CM

## 2023-09-08 DIAGNOSIS — D509 Iron deficiency anemia, unspecified: Secondary | ICD-10-CM

## 2023-09-08 LAB — HEMOGLOBIN, FINGERSTICK: Hemoglobin: 9.8 g/dL — ABNORMAL LOW (ref 11.1–15.9)

## 2023-09-08 NOTE — Assessment & Plan Note (Signed)
 28 weeks antibody screen negative. Plan for next rhogam at delivery.

## 2023-09-08 NOTE — Assessment & Plan Note (Addendum)
 Fingerstick Hgb 9.8 today, down from 10.3 on 7/14 and 12.9 on 3/24. PO iron  initiated 7/14. Tolerating PO iron  well now that it is every other day. Plan for next recheck around 36 weeks; if no improvement by that time, could offer IV iron .

## 2023-09-08 NOTE — Assessment & Plan Note (Addendum)
 Doing well. BP normal at 118/75. TWG 18 lb 9.6 oz (8.437 kg). Taking prenatal vitamin daily and aspirin 81 mg daily. Next prenatal appointment in 2 weeks.

## 2023-09-08 NOTE — Progress Notes (Signed)
  [facility] [address] [place] [address] Main phone: [phone]  Prenatal Visit  Subjective:  Meghan Peck is a 28 y.o. G2P1001 at [redacted]w[redacted]d being seen today for ongoing prenatal care.  She is currently monitored for the following issues for this low-risk pregnancy:   Patient Active Problem List   Diagnosis Date Noted   Iron  deficiency anemia during pregnancy 08/03/2023   Low grade squamous intraepithelial lesion on cytologic smear of cervix (LGSIL) 05/11/2023   Uterine fibroids affecting pregnancy 04/21/2023   GBS bacteriuria 04/17/2023   Supervision of other normal pregnancy, antepartum 04/13/2023   Blood type, Rh negative 09/28/2019   Patient reports no complaints.  Contractions: Not present. Vag. Bleeding: None.  Movement: Present. Denies leaking of fluid/ROM.   The following portions of the patient's history were reviewed and updated as appropriate: allergies, current medications, past family history, past medical history, past social history, past surgical history and problem list. Problem list updated.  Objective:   Vitals:   09/08/23 1506  BP: 118/75  Pulse: 75  Temp: (!) 97.2 F (36.2 C)  Weight: 138 lb 9.6 oz (62.9 kg)   Fetal Status: Fetal Heart Rate (bpm): 140 Fundal Height: 31 cm Movement: Present     General:  Alert, oriented and cooperative. Patient is in no acute distress.  Skin: Skin is warm and dry. No rash noted.   Cardiovascular: Normal heart rate noted  Respiratory: Normal respiratory effort, no problems with respiration noted  Abdomen: Soft, gravid, appropriate for gestational age.  Pain/Pressure: Absent     Pelvic: Cervical exam deferred        Extremities: Normal range of motion.     Mental Status: Normal mood and affect. Normal behavior. Normal judgment and thought content.   Assessment and Plan:  Pregnancy: G2P1001 at [redacted]w[redacted]d  [redacted] weeks gestation of  pregnancy  Supervision of other normal pregnancy, antepartum Assessment & Plan: Doing well. BP normal at 118/75. TWG 18 lb 9.6 oz (8.437 kg). Taking prenatal vitamin daily and aspirin 81 mg daily. Next prenatal appointment in 2 weeks.     Iron  deficiency anemia during pregnancy Assessment & Plan: Fingerstick Hgb 9.8 today, down from 10.3 on 7/14 and 12.9 on 3/24. PO iron  initiated 7/14. Tolerating PO iron  well now that it is every other day. Plan for next recheck around 36 weeks; if no improvement by that time, could offer IV iron .   Orders: -     Hemoglobin, fingerstick  GBS bacteriuria Assessment & Plan: Counseled regarding need for abx at delivery.    Blood type, Rh negative Assessment & Plan: 28 weeks antibody screen negative. Plan for next rhogam at delivery.     Preterm labor symptoms and general obstetric precautions including but not limited to vaginal bleeding, contractions, leaking of fluid and fetal movement were reviewed in detail with the patient. Please refer to After Visit Summary for other counseling recommendations.  No follow-ups on file.  Future Appointments  Date Time Provider Department Center  09/23/2023  3:20 PM [facility] PROVIDER [facility] None    Betsey CHRISTELLA Helling, MD

## 2023-09-08 NOTE — Progress Notes (Signed)
 In house hgb result reviewed during visit. Gery Kubas RN

## 2023-09-08 NOTE — Assessment & Plan Note (Signed)
 Counseled regarding need for abx at delivery.

## 2023-09-08 NOTE — Patient Instructions (Signed)
 Pregnancy Continue taking your prenatal vitamin daily and iron  tablet every other day.  Please schedule your next prenatal visit for about 2 weeks from now.  Please visit WIC (women's, infants, and children program) to see about their breast feeding peer support program. You can start this program to get tips and tricks for successful breast feeding of your baby.   Prenatal Classes If delivering at [facility] with [facility] or [facility]: Go to [email or web address]   If delivering at [facility] Area: Go to [email or web address] and search for: Pregnancy and Parenting Classes Prepared Childbirth Classes  Resource regarding exposures that could affect pregnancy: Mother [employer] [facility] is a Dentist with lots of information on the effects of many medications and exposures on your pregnancy. It is free to use, including their web site, phone line, text service, app, or email and live chat.  http://golden-thomas.org/ Email or live chat: MotherToBaby.org Phone: [phone] Text: (478)833-4495 App: search LactRx   Maternal Mental Health If you start to develop the below symptoms of depression, please reach out to us  for an appointment. There is also a Biomedical scientist Health Hotline at [phone]). This hotline has trained counselors, doulas, and midwifes to real-time support, information, and resources.  Feeling sad or hopeless most of the time Lack of interest in things you used to enjoy Less interest in caring for yourself (dressing, fixing hair) Trouble concentrating Trouble coping with daily tasks Constant worry about your baby Sleeping or eating too much or too little Feeling very anxious or nervous Unexplained irritability or anger Unwanted or scary thoughts Feeling that you are not a good mother Thoughts of hurting yourself or your baby  If you feel you are  experiencing a mental health crisis, please reach out to the National Suicide Prevention Hotline at 1-800-273-TALK 9376366197).

## 2023-09-15 ENCOUNTER — Ambulatory Visit: Payer: Self-pay

## 2023-09-23 ENCOUNTER — Ambulatory Visit: Admitting: Family Medicine

## 2023-09-23 VITALS — BP 127/68 | HR 103 | Temp 98.0°F | Wt 139.8 lb

## 2023-09-23 DIAGNOSIS — Z23 Encounter for immunization: Secondary | ICD-10-CM | POA: Diagnosis not present

## 2023-09-23 DIAGNOSIS — R87612 Low grade squamous intraepithelial lesion on cytologic smear of cervix (LGSIL): Secondary | ICD-10-CM

## 2023-09-23 DIAGNOSIS — Z6791 Unspecified blood type, Rh negative: Secondary | ICD-10-CM

## 2023-09-23 DIAGNOSIS — Z3483 Encounter for supervision of other normal pregnancy, third trimester: Secondary | ICD-10-CM

## 2023-09-23 DIAGNOSIS — Z2911 Encounter for prophylactic immunotherapy for respiratory syncytial virus (RSV): Secondary | ICD-10-CM

## 2023-09-23 DIAGNOSIS — Z3A36 36 weeks gestation of pregnancy: Secondary | ICD-10-CM | POA: Diagnosis not present

## 2023-09-23 DIAGNOSIS — R8271 Bacteriuria: Secondary | ICD-10-CM

## 2023-09-23 DIAGNOSIS — D509 Iron deficiency anemia, unspecified: Secondary | ICD-10-CM

## 2023-09-23 DIAGNOSIS — Z3480 Encounter for supervision of other normal pregnancy, unspecified trimester: Secondary | ICD-10-CM

## 2023-09-23 DIAGNOSIS — O99019 Anemia complicating pregnancy, unspecified trimester: Secondary | ICD-10-CM

## 2023-09-23 NOTE — Assessment & Plan Note (Signed)
 Will need abx at delivery.

## 2023-09-23 NOTE — Patient Instructions (Signed)
 Pregnancy Continue taking your prenatal vitamin daily and iron  tablet every other day.  Please schedule your next prenatal visit for about 1 weeks from now.  Next iron  check next week at [redacted] weeks gestation.   Please visit WIC (women's, infants, and children program) to see about their breast feeding peer support program. You can start this program to get tips and tricks for successful breast feeding of your baby.   Prenatal Classes If delivering at [facility] with [facility] or [facility]: Go to [email or web address]   If delivering at [facility]: Go to [email or web address] and search for: Pregnancy and Parenting Classes Prepared Childbirth Classes  Resource regarding exposures that could affect pregnancy: Mother To [facility] is a Dentist with lots of information on the effects of many medications and exposures on your pregnancy. It is free to use, including their web site, phone line, text service, app, or email and live chat.  http://golden-[email or web address].org/ Email or live chat: MotherToBaby.org Phone: (930) [phone] Text: [phone] App: search LactRx   Maternal Mental Health If you start to develop the below symptoms of depression, please reach out to us  for an appointment. There is also a Biomedical scientist Health Hotline at 9286140904 [phone]). This hotline has trained counselors, doulas, and midwifes to real-time support, information, and resources.  Feeling sad or hopeless most of the time Lack of interest in things you used to enjoy Less interest in caring for yourself (dressing, fixing hair) Trouble concentrating Trouble coping with daily tasks Constant worry about your baby Sleeping or eating too much or too little Feeling very anxious or nervous Unexplained irritability or anger Unwanted or scary thoughts Feeling that you are not a good mother Thoughts of hurting  yourself or your baby  If you feel you are experiencing a mental health crisis, please reach out to the National Suicide Prevention Hotline at 1-800-273-TALK 201-269-1988).

## 2023-09-23 NOTE — Assessment & Plan Note (Signed)
[ ]   Plan for colpo postpartum.

## 2023-09-23 NOTE — Progress Notes (Signed)
  [facility] [address] [place] [address] Main phone: [phone]  Prenatal Visit  Subjective:  Meghan Peck is a 28 y.o. G2P1001 at [redacted]w[redacted]d being seen today for ongoing prenatal care.  She is currently monitored for the following issues for this low-risk pregnancy:   Patient Active Problem List   Diagnosis Date Noted   Iron  deficiency anemia during pregnancy 08/03/2023   Low grade squamous intraepithelial lesion on cytologic smear of cervix (LGSIL) 05/11/2023   Uterine fibroids affecting pregnancy 04/21/2023   GBS bacteriuria 04/17/2023   Supervision of other normal pregnancy, antepartum 04/13/2023   Blood type, Rh negative 09/28/2019   Patient reports no complaints.  Contractions: Not present. Vag. Bleeding: None.  Movement: Present. Denies leaking of fluid/ROM.   The following portions of the patient's history were reviewed and updated as appropriate: allergies, current medications, past family history, past medical history, past social history, past surgical history and problem list. Problem list updated.  Objective:   Vitals:   09/23/23 1543  BP: 127/68  Pulse: (!) 103  Temp: 98 F (36.7 C)  Weight: 139 lb 12.8 oz (63.4 kg)   Fetal Status: Fetal Heart Rate (bpm): 146 Fundal Height: 33 cm Movement: Present  Presentation: Vertex  General:  Alert, oriented and cooperative. Patient is in no acute distress.  Skin: Skin is warm and dry. No rash noted.   Cardiovascular: Normal heart rate noted  Respiratory: Normal respiratory effort, no problems with respiration noted  Abdomen: Soft, gravid, appropriate for gestational age.  Pain/Pressure: Absent     Pelvic: Cervical exam deferred        Extremities: Normal range of motion.     Mental Status: Normal mood and affect. Normal behavior. Normal judgment and thought content.   Assessment and Plan:  Pregnancy: G2P1001 at [redacted]w[redacted]d  [redacted] weeks gestation  of pregnancy  Supervision of other normal pregnancy, antepartum Assessment & Plan: Doing well. BP normal. No s/s pre-eclampsia. TWG 19 lb 12.8 oz (8.981 kg). Taking prenatal vitamin daily and iron  tablet every other day. Next prenatal appointment in 1 weeks.   Discussed or disclosed in AVS: - Breast feeding support through [facility] breast feeding peer counselor program - Mood resources, including 988, Maternal Mental Health Line, and counselor Alan Hail, LCSW, here at [facility] - Prenatal classes - Pre-eclampsia warning signs: Signs and symptoms of preeclampsia were verbally reviewed with warning signs, when and how to call. A written handout with tips on how to take your blood pressure, as well as warning signs was provided to the patient.    Orders: -     Chlamydia/GC NAA, Confirmation  Iron  deficiency anemia during pregnancy Assessment & Plan: Plan for Hgb recheck next week at 37 weeks. Has only been 2 weeks since last check.    GBS bacteriuria Assessment & Plan: Will need abx at delivery.    Blood type, Rh negative Assessment & Plan: 28 weeks antibody screen negative. Plan for next rhogam at delivery.    Low grade squamous intraepithelial lesion on cytologic smear of cervix (LGSIL) Assessment & Plan: Plan for colpo postpartum.    Preterm labor symptoms and general obstetric precautions including but not limited to vaginal bleeding, contractions, leaking of fluid and fetal movement were reviewed in detail with the patient. Please refer to After Visit Summary for other counseling recommendations.  Return in about 1 week (around 09/30/2023).  No future appointments.  Betsey CHRISTELLA Helling, MD

## 2023-09-23 NOTE — Progress Notes (Addendum)
 Here for MH RV at 36 weeks. 36 week labs and packet given today. Desires flu and RSV vaccines today. Self-collecting GC/Chl. Today.Izetta Parish, RN   Given flu vaccine and RSV vaccine, tolerated well, VIS given for each, NCIR mailed to patient. Hulan Parish, RN

## 2023-09-23 NOTE — Assessment & Plan Note (Signed)
 28 weeks antibody screen negative. Plan for next rhogam at delivery.

## 2023-09-23 NOTE — Assessment & Plan Note (Signed)
 Doing well. BP normal. No s/s pre-eclampsia. TWG 19 lb 12.8 oz (8.981 kg). Taking prenatal vitamin daily and iron  tablet every other day. Next prenatal appointment in 1 weeks.   Discussed or disclosed in AVS: - Breast feeding support through [facility] breast feeding peer counselor program - Mood resources, including 988, Maternal Mental Health Line, and counselor Alan Hail, LCSW, here at [facility] - Prenatal classes - Pre-eclampsia warning signs: Signs and symptoms of preeclampsia were verbally reviewed with warning signs, when and how to call. A written handout with tips on how to take your blood pressure, as well as warning signs was provided to the patient.

## 2023-09-23 NOTE — Assessment & Plan Note (Signed)
 Plan for Hgb recheck next week at 37 weeks. Has only been 2 weeks since last check.

## 2023-09-26 LAB — CHLAMYDIA/GC NAA, CONFIRMATION
Chlamydia trachomatis, NAA: NEGATIVE
Neisseria gonorrhoeae, NAA: NEGATIVE

## 2023-09-30 ENCOUNTER — Ambulatory Visit: Payer: Self-pay | Admitting: Family Medicine

## 2023-09-30 NOTE — Progress Notes (Signed)
 Known GBS from earlier this pregnancy. Negative G/C screen.   Dorothyann Helling, MD 09/30/23  4:37 PM

## 2023-10-01 ENCOUNTER — Telehealth: Payer: Self-pay

## 2023-10-01 ENCOUNTER — Ambulatory Visit

## 2023-10-01 NOTE — Telephone Encounter (Signed)
 Call to client to reschedule 10/01/23 [facility] RV appt that she was unable to keep. Left message to call requesting she reschedule appt and number to call provided. Burnadette Lowers, RN

## 2023-10-02 NOTE — Telephone Encounter (Signed)
 Call to client to reschedule 10/01/23 [facility] RV. Left message to call requesting she reschedule appt and number to call provided. Consuelo Kingsley RN

## 2023-10-07 ENCOUNTER — Ambulatory Visit: Admitting: Physician Assistant

## 2023-10-07 VITALS — BP 125/76 | HR 86 | Temp 97.6°F | Wt 144.4 lb

## 2023-10-07 DIAGNOSIS — D649 Anemia, unspecified: Secondary | ICD-10-CM

## 2023-10-07 DIAGNOSIS — Z3480 Encounter for supervision of other normal pregnancy, unspecified trimester: Secondary | ICD-10-CM

## 2023-10-07 DIAGNOSIS — Z3483 Encounter for supervision of other normal pregnancy, third trimester: Secondary | ICD-10-CM

## 2023-10-07 DIAGNOSIS — R8271 Bacteriuria: Secondary | ICD-10-CM

## 2023-10-07 DIAGNOSIS — Z3A38 38 weeks gestation of pregnancy: Secondary | ICD-10-CM

## 2023-10-07 LAB — HEMOGLOBIN, FINGERSTICK: Hemoglobin: 9.4 g/dL — ABNORMAL LOW (ref 11.1–15.9)

## 2023-10-07 NOTE — Progress Notes (Addendum)
 Here for MH RV at 38 weeks. Needs Hgb check today.Hulan Parish, RN   Hgb = 9.4 today. Counseled to continue taking iron  every other day with vitamin C rich juice.Izetta Parish, RN

## 2023-10-07 NOTE — Progress Notes (Signed)
  [facility] [address] [place] [address] Main phone: [phone]  Prenatal Visit  Subjective:  Meghan Peck is a 28 y.o. G2P1001 at [redacted]w[redacted]d being seen today for ongoing prenatal care.  She is currently monitored for the following issues for this low-risk pregnancy:   Patient Active Problem List   Diagnosis Date Noted   Iron  deficiency anemia during pregnancy 08/03/2023   Low grade squamous intraepithelial lesion on cytologic smear of cervix (LGSIL) 05/11/2023   Uterine fibroids affecting pregnancy 04/21/2023   GBS bacteriuria 04/17/2023   Supervision of other normal pregnancy, antepartum 04/13/2023   Blood type, Rh negative 09/28/2019   Patient reports backache, no bleeding, no contractions, no cramping, and no leaking.  Contractions: Not present. Vag. Bleeding: None.  Movement: Present. Denies leaking of fluid/ROM.  Did have some pain yesterday in lower abdomen, felt sharp. Went away on it's own and only lasted a short time.  The following portions of the patient's history were reviewed and updated as appropriate: allergies, current medications, past family history, past medical history, past social history, past surgical history and problem list. Problem list updated.  Objective:   Vitals:   10/07/23 1548  BP: 125/76  Pulse: 86  Temp: 97.6 F (36.4 C)  Weight: 144 lb 6.4 oz (65.5 kg)   Fetal Status: Fetal Heart Rate (bpm): 160 Fundal Height: 35 cm Movement: Present  Presentation: Vertex  General:  Alert, oriented and cooperative. Patient is in no acute distress.  Skin: Skin is warm and dry. No rash noted.   Cardiovascular: Normal heart rate noted  Respiratory: Normal respiratory effort, no problems with respiration noted  Abdomen: Soft, gravid, appropriate for gestational age.  Pain/Pressure: Absent     Pelvic: Cervical exam deferred        Extremities: Normal range of motion.  Edema: None   Mental Status: Normal mood and affect. Normal behavior. Normal judgment and thought content.   Assessment and Plan:  Pregnancy: G2P1001 at [redacted]w[redacted]d  1. [redacted] weeks gestation of pregnancy (Primary) - Doing well, no signs of labor yet - Has all her supplies for baby, car seat - Last day of work at [employer] is 10/10/23 - Reports her first labor was fast. Discussed labor precautions, that this labor could possibly be even faster as it is her second. She will stay in [place] closer to [facility] for easier transport when she goes into labor.  2. Supervision of other normal pregnancy, antepartum  - Hemoglobin, fingerstick  3. Anemia, unspecified type  - Hemoglobin trending down. 4 weeks ago was 9.8, today is 9.4. She confirms she is taking her oral iron  every other day. - Discussed possibility of getting an iron  infusion scheduled, but difficulty this late in pregnancy. She went into labor with her first child spontaneously at [redacted]w[redacted]d.  - Meghan Peck would prefer not to try and get an iron  infusion before delivery. Advised her to continue her iron  supplement, and that she may need an iron  infusion at delivery. - Hemoglobin, fingerstick  4. GBS bacteriuria  - Will treat in labor   Term labor symptoms and general obstetric precautions including but not limited to vaginal bleeding, contractions, leaking of fluid and fetal movement were reviewed in detail with the patient. Please refer to After Visit Summary for other counseling recommendations.   Return in about 1 week (around 10/14/2023).  No future appointments.  Damien FORBES Satchel, NP

## 2023-10-08 ENCOUNTER — Ambulatory Visit: Payer: Self-pay

## 2023-10-09 NOTE — Telephone Encounter (Signed)
 Call to client to notify her breast pump order form received form [employer] and given to provider for completion. Counseled RN will fax forms when completed. Burnadette Lowers, RN

## 2023-10-09 NOTE — Telephone Encounter (Signed)
Opened in error. Adalynne Steffensmeier, RN  

## 2023-10-13 ENCOUNTER — Other Ambulatory Visit: Payer: Self-pay

## 2023-10-13 ENCOUNTER — Telehealth: Payer: Self-pay

## 2023-10-13 ENCOUNTER — Inpatient Hospital Stay

## 2023-10-13 ENCOUNTER — Encounter: Payer: Self-pay | Admitting: Emergency Medicine

## 2023-10-13 ENCOUNTER — Inpatient Hospital Stay
Admission: EM | Admit: 2023-10-13 | Discharge: 2023-10-14 | DRG: 806 | Disposition: A | Discharge status: Home / Self Care | Attending: Obstetrics | Admitting: Obstetrics

## 2023-10-13 DIAGNOSIS — O26849 Uterine size-date discrepancy, unspecified trimester: Secondary | ICD-10-CM | POA: Diagnosis present

## 2023-10-13 DIAGNOSIS — O4103X0 Oligohydramnios, third trimester, not applicable or unspecified: Secondary | ICD-10-CM | POA: Diagnosis present

## 2023-10-13 DIAGNOSIS — D259 Leiomyoma of uterus, unspecified: Secondary | ICD-10-CM

## 2023-10-13 DIAGNOSIS — D509 Iron deficiency anemia, unspecified: Secondary | ICD-10-CM | POA: Diagnosis present

## 2023-10-13 DIAGNOSIS — R8271 Bacteriuria: Secondary | ICD-10-CM

## 2023-10-13 DIAGNOSIS — Z3A38 38 weeks gestation of pregnancy: Secondary | ICD-10-CM | POA: Diagnosis not present

## 2023-10-13 DIAGNOSIS — O26893 Other specified pregnancy related conditions, third trimester: Secondary | ICD-10-CM | POA: Diagnosis present

## 2023-10-13 DIAGNOSIS — Z6791 Unspecified blood type, Rh negative: Secondary | ICD-10-CM | POA: Diagnosis not present

## 2023-10-13 DIAGNOSIS — O3412 Maternal care for benign tumor of corpus uteri, second trimester: Secondary | ICD-10-CM

## 2023-10-13 DIAGNOSIS — O4100X0 Oligohydramnios, unspecified trimester, not applicable or unspecified: Secondary | ICD-10-CM | POA: Diagnosis present

## 2023-10-13 DIAGNOSIS — O364XX0 Maternal care for intrauterine death, not applicable or unspecified: Secondary | ICD-10-CM | POA: Diagnosis present

## 2023-10-13 DIAGNOSIS — O99824 Streptococcus B carrier state complicating childbirth: Secondary | ICD-10-CM | POA: Diagnosis present

## 2023-10-13 DIAGNOSIS — Z3483 Encounter for supervision of other normal pregnancy, third trimester: Secondary | ICD-10-CM | POA: Diagnosis not present

## 2023-10-13 DIAGNOSIS — Z3480 Encounter for supervision of other normal pregnancy, unspecified trimester: Principal | ICD-10-CM

## 2023-10-13 DIAGNOSIS — O368190 Decreased fetal movements, unspecified trimester, not applicable or unspecified: Secondary | ICD-10-CM | POA: Diagnosis present

## 2023-10-13 DIAGNOSIS — Z8249 Family history of ischemic heart disease and other diseases of the circulatory system: Secondary | ICD-10-CM

## 2023-10-13 DIAGNOSIS — O9902 Anemia complicating childbirth: Secondary | ICD-10-CM | POA: Diagnosis present

## 2023-10-13 DIAGNOSIS — O368130 Decreased fetal movements, third trimester, not applicable or unspecified: Secondary | ICD-10-CM | POA: Diagnosis present

## 2023-10-13 LAB — COMPREHENSIVE METABOLIC PANEL WITH GFR
ALT: 12 U/L (ref 0–44)
AST: 22 U/L (ref 15–41)
Albumin: 3.2 g/dL — ABNORMAL LOW (ref 3.5–5.0)
Alkaline Phosphatase: 90 U/L (ref 38–126)
Anion gap: 9 (ref 5–15)
BUN: 8 mg/dL (ref 6–20)
CO2: 22 mmol/L (ref 22–32)
Calcium: 9.4 mg/dL (ref 8.9–10.3)
Chloride: 104 mmol/L (ref 98–111)
Creatinine, Ser: 0.54 mg/dL (ref 0.44–1.00)
GFR, Estimated: >60 mL/min (ref >60)
Glucose, Bld: 88 mg/dL (ref 70–99)
Potassium: 3.7 mmol/L (ref 3.5–5.1)
Sodium: 135 mmol/L (ref 135–145)
Total Bilirubin: 0.5 mg/dL (ref 0.0–1.2)
Total Protein: 6.9 g/dL (ref 6.5–8.1)

## 2023-10-13 LAB — KLEIHAUER-BETKE STAIN
# Vials RhIg: 1
Fetal Cells %: 0 %
Quantitation Fetal Hemoglobin: 0 mL

## 2023-10-13 LAB — CBC WITH DIFFERENTIAL/PLATELET
Abs Immature Granulocytes: 0.05 K/uL (ref 0.00–0.07)
Basophils Absolute: 0 K/uL (ref 0.0–0.1)
Basophils Relative: 0 %
Eosinophils Absolute: 0.1 K/uL (ref 0.0–0.5)
Eosinophils Relative: 1 %
HCT: 38.6 % (ref 36.0–46.0)
Hemoglobin: 12.1 g/dL (ref 12.0–15.0)
Immature Granulocytes: 1 %
Lymphocytes Relative: 19 %
Lymphs Abs: 1.4 K/uL (ref 0.7–4.0)
MCH: 25 pg — ABNORMAL LOW (ref 26.0–34.0)
MCHC: 31.3 g/dL (ref 30.0–36.0)
MCV: 79.8 fL — ABNORMAL LOW (ref 80.0–100.0)
Monocytes Absolute: 0.6 K/uL (ref 0.1–1.0)
Monocytes Relative: 8 %
Neutro Abs: 5.1 K/uL (ref 1.7–7.7)
Neutrophils Relative %: 71 %
Platelets: 161 K/uL (ref 150–400)
RBC: 4.84 MIL/uL (ref 3.87–5.11)
RDW: 17.3 % — ABNORMAL HIGH (ref 11.5–15.5)
Smear Review: NORMAL
WBC: 7.3 K/uL (ref 4.0–10.5)
nRBC: 0 % (ref 0.0–0.2)

## 2023-10-13 LAB — URINE DRUG SCREEN, QUALITATIVE (ARMC ONLY)
Amphetamines, Ur Screen: NOT DETECTED
Barbiturates, Ur Screen: NOT DETECTED
Benzodiazepine, Ur Scrn: NOT DETECTED
Cannabinoid 50 Ng, Ur ~~LOC~~: NOT DETECTED
Cocaine Metabolite,Ur ~~LOC~~: NOT DETECTED
MDMA (Ecstasy)Ur Screen: NOT DETECTED
Methadone Scn, Ur: NOT DETECTED
Opiate, Ur Screen: NOT DETECTED
Phencyclidine (PCP) Ur S: NOT DETECTED
Tricyclic, Ur Screen: NOT DETECTED

## 2023-10-13 LAB — TSH: TSH: 2.487 u[IU]/mL (ref 0.350–4.500)

## 2023-10-13 LAB — TYPE AND SCREEN
ABO/RH(D): O NEG
Antibody Screen: POSITIVE

## 2023-10-13 LAB — PROTIME-INR
INR: 0.9 (ref 0.8–1.2)
Prothrombin Time: 13.1 s (ref 11.4–15.2)

## 2023-10-13 LAB — PROTEIN / CREATININE RATIO, URINE
Creatinine, Urine: 112 mg/dL
Protein Creatinine Ratio: 0.11 mg/mg{creat} (ref 0.00–0.15)
Total Protein, Urine: 12 mg/dL

## 2023-10-13 LAB — FIBRINOGEN: Fibrinogen: 446 mg/dL (ref 210–475)

## 2023-10-13 LAB — ABO/RH: ABO/RH(D): O NEG

## 2023-10-13 LAB — APTT: aPTT: 29 s (ref 24–36)

## 2023-10-13 MED ORDER — ACETAMINOPHEN 325 MG PO TABS: 650.0000 mg | ORAL_TABLET | ORAL | Status: DC | PRN

## 2023-10-13 MED ORDER — ZOLPIDEM TARTRATE 5 MG PO TABS: 5.0000 mg | ORAL_TABLET | Freq: Every evening | ORAL | Status: DC | PRN

## 2023-10-13 MED ORDER — FENTANYL CITRATE (PF) 100 MCG/2ML IJ SOLN
50.0000 ug | INTRAMUSCULAR | Status: DC | PRN
  Administered 2023-10-13 (×2): 100 ug via INTRAVENOUS
  Filled 2023-10-13 (×2): qty 2

## 2023-10-13 MED ORDER — MISOPROSTOL 50MCG HALF TABLET
50.0000 ug | ORAL_TABLET | ORAL | Status: DC
  Administered 2023-10-13: 50 ug via VAGINAL

## 2023-10-13 MED ORDER — SIMETHICONE 80 MG PO CHEW: 80.0000 mg | CHEWABLE_TABLET | ORAL | Status: DC | PRN

## 2023-10-13 MED ORDER — MISOPROSTOL 200 MCG PO TABS
ORAL_TABLET | ORAL | Status: AC
  Filled 2023-10-13: qty 4

## 2023-10-13 MED ORDER — ONDANSETRON HCL 4 MG PO TABS: 4.0000 mg | ORAL_TABLET | ORAL | Status: DC | PRN

## 2023-10-13 MED ORDER — AMMONIA AROMATIC IN INHA
RESPIRATORY_TRACT | Status: AC
  Filled 2023-10-13: qty 10

## 2023-10-13 MED ORDER — OXYTOCIN BOLUS FROM INFUSION: 333.0000 mL | Freq: Once | INTRAVENOUS | Status: AC

## 2023-10-13 MED ORDER — SOD CITRATE-CITRIC ACID 500-334 MG/5ML PO SOLN: 30.0000 mL | ORAL | Status: DC | PRN

## 2023-10-13 MED ORDER — OXYTOCIN-SODIUM CHLORIDE 30-0.9 UT/500ML-% IV SOLN
INTRAVENOUS | Status: AC
  Administered 2023-10-13: 333 mL via INTRAVENOUS
  Filled 2023-10-13: qty 500

## 2023-10-13 MED ORDER — LACTATED RINGERS IV SOLN: INTRAVENOUS | Status: DC

## 2023-10-13 MED ORDER — MISOPROSTOL 50MCG HALF TABLET
ORAL_TABLET | ORAL | Status: AC
  Filled 2023-10-13: qty 2

## 2023-10-13 MED ORDER — LIDOCAINE HCL (PF) 1 % IJ SOLN: 30.0000 mL | INTRAMUSCULAR | Status: DC | PRN

## 2023-10-13 MED ORDER — LIDOCAINE HCL (PF) 1 % IJ SOLN
INTRAMUSCULAR | Status: AC
  Filled 2023-10-13: qty 30

## 2023-10-13 MED ORDER — TERBUTALINE SULFATE 1 MG/ML IJ SOLN: 0.2500 mg | Freq: Once | INTRAMUSCULAR | Status: DC | PRN

## 2023-10-13 MED ORDER — ACETAMINOPHEN 325 MG PO TABS
650.0000 mg | ORAL_TABLET | ORAL | Status: DC | PRN
  Administered 2023-10-14: 650 mg via ORAL
  Filled 2023-10-13 (×2): qty 2

## 2023-10-13 MED ORDER — IBUPROFEN 600 MG PO TABS
600.0000 mg | ORAL_TABLET | Freq: Four times a day (QID) | ORAL | Status: DC
  Administered 2023-10-14 (×2): 600 mg via ORAL
  Filled 2023-10-13 (×3): qty 1

## 2023-10-13 MED ORDER — OXYTOCIN-SODIUM CHLORIDE 30-0.9 UT/500ML-% IV SOLN: 1.0000 m[IU]/min | INTRAVENOUS | Status: DC

## 2023-10-13 MED ORDER — DIBUCAINE (PERIANAL) 1 % EX OINT
1.0000 | TOPICAL_OINTMENT | CUTANEOUS | Status: DC | PRN
  Administered 2023-10-13: 1 via RECTAL
  Filled 2023-10-13: qty 28

## 2023-10-13 MED ORDER — CALCIUM CARBONATE ANTACID 500 MG PO CHEW: 2.0000 | CHEWABLE_TABLET | ORAL | Status: DC | PRN

## 2023-10-13 MED ORDER — OXYTOCIN 10 UNIT/ML IJ SOLN
INTRAMUSCULAR | Status: AC
  Filled 2023-10-13: qty 2

## 2023-10-13 MED ORDER — PRENATAL MULTIVITAMIN CH
1.0000 | ORAL_TABLET | Freq: Every day | ORAL | Status: DC
  Filled 2023-10-13: qty 1

## 2023-10-13 MED ORDER — DIPHENHYDRAMINE HCL 25 MG PO CAPS: 25.0000 mg | ORAL_CAPSULE | Freq: Four times a day (QID) | ORAL | Status: DC | PRN

## 2023-10-13 MED ORDER — ONDANSETRON HCL 4 MG/2ML IJ SOLN: 4.0000 mg | INTRAMUSCULAR | Status: DC | PRN

## 2023-10-13 MED ORDER — OXYTOCIN-SODIUM CHLORIDE 30-0.9 UT/500ML-% IV SOLN: 2.5000 [IU]/h | INTRAVENOUS | Status: DC

## 2023-10-13 MED ORDER — SENNOSIDES-DOCUSATE SODIUM 8.6-50 MG PO TABS
2.0000 | ORAL_TABLET | Freq: Every day | ORAL | Status: DC
  Administered 2023-10-14: 2 via ORAL
  Filled 2023-10-13: qty 2

## 2023-10-13 MED ORDER — WITCH HAZEL-GLYCERIN EX PADS
1.0000 | MEDICATED_PAD | CUTANEOUS | Status: DC | PRN
  Administered 2023-10-13: 1 via TOPICAL
  Filled 2023-10-13: qty 100

## 2023-10-13 MED ORDER — ONDANSETRON HCL 4 MG/2ML IJ SOLN: 4.0000 mg | Freq: Four times a day (QID) | INTRAMUSCULAR | Status: DC | PRN

## 2023-10-13 MED ORDER — MISOPROSTOL 50MCG HALF TABLET
50.0000 ug | ORAL_TABLET | ORAL | Status: DC
  Administered 2023-10-13: 50 ug via ORAL

## 2023-10-13 MED ORDER — COCONUT OIL OIL
1.0000 | TOPICAL_OIL | Status: DC | PRN
  Filled 2023-10-13: qty 7.5

## 2023-10-13 MED ORDER — LACTATED RINGERS IV SOLN: 500.0000 mL | INTRAVENOUS | Status: DC | PRN

## 2023-10-13 MED ORDER — BENZOCAINE-MENTHOL 20-0.5 % EX AERO
1.0000 | INHALATION_SPRAY | CUTANEOUS | Status: DC | PRN
  Administered 2023-10-13: 1 via TOPICAL
  Filled 2023-10-13: qty 56

## 2023-10-13 NOTE — Discharge Summary (Signed)
 Postpartum Discharge Summary  Patient Name: Meghan Peck DOB: 29-Mar-1995 MRN: [id number]  Date of admission: 10/13/2023 Delivery date:10/13/2023 Delivering provider: TANDA HOUSTON RENEE Date of discharge: 10/14/2023  Primary OB: [facility] OFE:Ejupzwu'd last menstrual period was 01/21/2023 (approximate). EDC Estimated Date of Delivery: 10/21/23 Gestational Age at Delivery: [redacted]w[redacted]d   Admitting diagnosis: Decreased fetal movement [O36.8190] IUFD at 20 weeks or more of gestation [O36.4XX0] Intrauterine pregnancy: [redacted]w[redacted]d     Secondary diagnosis:   Principal Problem:   IUFD at 20 weeks or more of gestation Active Problems:   Blood type, Rh negative   Iron  deficiency anemia during pregnancy   Decreased fetal movement   Oligohydramnios   Uterine size date discrepancy   NSVD (normal spontaneous vaginal delivery)   Discharge Diagnosis: Term Pregnancy Delivered and stillbirth                                                Post partum procedures:rhogam Induction:: Cytotec  Complications: Stillborn Delivery Type: spontaneous vaginal delivery Anesthesia: IV narcotics Placenta: spontaneous To Pathology: Yes  Laceration: right periurethral, hemostatic and approximated Episiotomy: none  Prenatal Labs:  Blood type/Rh O negative  Antibody screen neg  Rubella Immune  Varicella pending  RPR NR  HBsAg Neg  HIV NR  GC neg  Chlamydia neg  Genetic screening negative  1 hour GTT 65  3 hour GTT    GBS Positive by NOB urine    Hospital course: Induction of Labor With Vaginal Delivery   28 y.o. yo G2P2001 at [redacted]w[redacted]d was admitted to the hospital 10/13/2023 for induction of labor.  Indication for induction: IUFD.  Patient had an labor course complicated by precipitous birth. She rapidly progressed to 10/100/+2 and pushed , delivering nonviable female infant en caul over intact perineum. Membrane Rupture Time/Date: 9:29 PM,10/13/2023  Delivery Method:Vaginal, Spontaneous Operative  Delivery:N/A Episiotomy: None Lacerations:    Details of delivery can be found in separate delivery note.  Patient had an uncomplicated postpartum course. Patient is discharged home 10/14/23.  Newborn Data: Meghan Peck Birth date:10/13/2023 Birth time:9:29 PM Gender:Female Living status:Fetal Demise Apgars:0 ,0  [phone] g  Magnesium Sulfate received: No BMZ received: No Rhophylac :Yes MMR:No Varivax vaccine given: was not indicated T-DaP:Given prenatally Flu: No  Transfusion:No  Physical exam  Vitals:   10/14/23 0712 10/14/23 0728 10/14/23 0743 10/14/23 0758  BP: (!) 155/65 (!) 144/86 124/78 131/87  Pulse: 81 75 87 80  Resp: 18     Temp: 98.2 F (36.8 C)     TempSrc: Oral     SpO2:      Weight:      Height:       General: alert, cooperative, and no distress Lochia: appropriate Uterine Fundus: firm Perineum:minimal edema/intact DVT Evaluation: No evidence of DVT seen on physical exam. Negative Homan's sign. No cords or calf tenderness. No significant calf/ankle edema.  Labs: Lab Results  Component Value Date   WBC 11.9 (H) 10/14/2023   HGB 12.8 10/14/2023   HCT 40.2 10/14/2023   MCV 78.7 (L) 10/14/2023   PLT 272 10/14/2023      Latest Ref Rng & Units 10/13/2023    1:25 PM  CMP  Glucose 70 - 99 mg/dL 88   BUN 6 - 20 mg/dL 8   Creatinine 9.55 - 8.99 mg/dL 9.45   Sodium 864 - 854 mmol/L 135   Potassium 3.5 -  5.1 mmol/L 3.7   Chloride 98 - 111 mmol/L 104   CO2 22 - 32 mmol/L 22   Calcium  8.9 - 10.3 mg/dL 9.4   Total Protein 6.5 - 8.1 g/dL 6.9   Total Bilirubin 0.0 - 1.2 mg/dL 0.5   Alkaline Phos 38 - 126 U/L 90   AST 15 - 41 U/L 22   ALT 0 - 44 U/L 12    Edinburgh Score:    09/23/2023    5:24 PM  Edinburgh Postnatal Depression Scale Screening Tool  I have been able to laugh and see the funny side of things. 0  I have looked forward with enjoyment to things. 0  I have blamed myself unnecessarily when things went wrong. 0  I have been anxious or  worried for no good reason. 0  I have felt scared or panicky for no good reason. 0  Things have been getting on top of me. 0  I have been so unhappy that I have had difficulty sleeping. 0  I have felt sad or miserable. 0  I have been so unhappy that I have been crying. 0  The thought of harming myself has occurred to me. 0  Edinburgh Postnatal Depression Scale Total 0    Risk assessment for postpartum VTE and prophylactic treatment: Very high risk factors: None High risk factors: None Moderate risk factors: None  Postpartum VTE prophylaxis with LMWH not indicated  After visit meds:  Allergies as of 10/14/2023   No Known Allergies      Medication List     STOP taking these medications    Doxylamine -Pyridoxine  10-10 MG Tbec   Iron  (Ferrous Sulfate ) 325 (65 Fe) MG Tabs   Prenatal Adult Gummy/DHA/FA 0.4-25 MG Chew       TAKE these medications    acetaminophen  325 MG tablet Commonly known as: Tylenol  Take 2 tablets (650 mg total) by mouth every 4 (four) hours as needed (for pain scale < 4).   benzocaine -Menthol  20-0.5 % Aero Commonly known as: DERMOPLAST Apply 1 Application topically as needed for irritation (perineal discomfort).   dibucaine 1 % Oint Commonly known as: NUPERCAINAL Place 1 Application rectally as needed for hemorrhoids.   ibuprofen  600 MG tablet Commonly known as: ADVIL  Take 1 tablet (600 mg total) by mouth every 6 (six) hours.   senna-docusate 8.6-50 MG tablet Commonly known as: Senokot-S Take 2 tablets by mouth daily.   simethicone  80 MG chewable tablet Commonly known as: MYLICON Chew 1 tablet (80 mg total) by mouth as needed for flatulence.   witch hazel-glycerin  pad Commonly known as: TUCKS Apply 1 Application topically as needed for hemorrhoids.       Discharge home in stable condition Infant Disposition:morgue Discharge instruction: per After Visit Summary and Postpartum booklet. Activity: Advance as tolerated. Pelvic rest for  6 weeks.  Diet: routine diet Anticipated Birth Control: Unsure Postpartum Appointment:6 weeks Additional Postpartum F/U: Postpartum Depression checkup Future Appointments:No future appointments. Follow up Visit:  Follow-up Information     Tanda Edsel Fuller, CNM Follow up in 2 week(s).   Specialty: Certified Nurse Midwife Why: 2wk mood check Contact information: [address] [place] [address] [phone]         Schermerhorn, Beverli GAILS, MD Follow up in 4 week(s).   Specialty: Obstetrics and Gynecology Why: follow up for IUFD Contact information: [address] [place] [address] [phone]         Tanda Edsel Fuller, CNM Follow up in 6  week(s).   Specialty: Certified Nurse Midwife Why: postpartum visit Contact information: [address] [place] [address] [phone]                 Plan:  Meghan Peck was discharged to home in good condition. Follow-up appointment as directed.    Signed:  Mirtie Bastyr, CNM 10/14/2023 1:42 PM

## 2023-10-13 NOTE — Telephone Encounter (Signed)
 Meghan Peck called requesting to speak to nurse. Tearful stating that the baby has no heart beat and the plan was for her to be induced tomorrow. Support given and patient told we will call her this week to follow up. Consuelo Kingsley RN

## 2023-10-13 NOTE — OB Triage Note (Signed)
 Pt states that baby hasn't been moving a lot. States that she hasn't felt baby move much during pregnancy due to anterior placenta.   Denies vaginal bleeding and LOF.   Denies contractions.   No heart rate detected. RN asked CNM to assist.

## 2023-10-13 NOTE — Telephone Encounter (Signed)
 Meghan Peck,Pregnancy CM notified of pregnancy outcome. BTHIELE RN

## 2023-10-13 NOTE — Progress Notes (Signed)
 Labor Progress Note  Meghan Peck is a 28 y.o. G2P1001 at [redacted]w[redacted]d by ultrasound admitted for induction of labor due to IUFD.  Subjective: she reports her contractions are getting stronger, 8/10 pain, received IVPM  Objective: BP 139/84   Pulse 85   Temp 97.8 F (36.6 C) (Oral)   Resp 17   Ht 5' (1.524 m)   Wt 74.4 kg   LMP 01/21/2023 (Approximate)   SpO2 99%   BMI 32.03 kg/m  Notable VS details: reviewed  Fetal Assessment: n/a, IUFD UC:   regular, every 2-4 minutes SVE:    Dilation: 4cm  Effacement: 100%  Station:  +1  Consistency: soft  Position: middle  Membrane status:intact Amniotic color: n/a  Labs: Lab Results  Component Value Date   WBC 7.3 10/13/2023   HGB 12.1 10/13/2023   HCT 38.6 10/13/2023   MCV 79.8 (L) 10/13/2023   PLT 161 10/13/2023    Assessment / Plan: 28 year old G2P1001 at [redacted]w[redacted]d with IOL for IUFD  Labor: Good labor progress after cytotec  39mcg/50mcg Preeclampsia:  labs stable Pain Control:  IV pain meds I/D:  intact BOW, afebrile, normal WBC count Anticipated MOD:  NSVD  Edsel Charlies Blush, CNM 10/13/2023, 10:34 PM

## 2023-10-13 NOTE — H&P (Addendum)
 OB History & Physical   History of Present Illness:  Chief Complaint:   HPI:  Meghan Peck is a 28 y.o. G81P1001 female at [redacted]w[redacted]d dated by 13wk US .  She presents to L&D for decreased fetal movement. She reports with her anterior placenta she does not feel normal fetal movement. However, she has noticed a decrease in what has been normal for her baby's movements over the last week. She states she thinks she felt her baby move one time this morning, but states just his bottom moved.  L&D RN T. Hunter attempted to monitor FHT with EFM but was unable to detect a FHR. I was called into the room urgently to evaluate for a fetal heart beat. BSUS performed and cephalic fetus with no cardiac motion or tone noted on BSUS. Dr. CHARM Schermerhorn notified and to bedside to perform BSUS as well. STAT OB US  ordered which confirmed IUFD. Provided patient with emotional support.   Patient requesting to be induced today.    Pregnancy Issues: 1. Anemia 2. Rh negative 3. GBS positive  Maternal Medical History:   Past Medical History:  Diagnosis Date   Anemia 08/03/2023   Dyspnea    states sob when she gets hot    Past Surgical History:  Procedure Laterality Date   denies     denies hx of c section    No Known Allergies  Prior to Admission medications   Medication Sig Start Date End Date Taking? Authorizing Provider  Doxylamine -Pyridoxine  10-10 MG TBEC (Diclegis (R)) Initial, 2 tablets (doxylamine  succinate 10 mg/pyridoxine  hydrochloride 10 mg per tablet) orally at bedtime on day 1 and 2; if symptoms persist, take 1 tablet in morning and 2 tablets at bedtime on day 3; if symptoms persist, may increase to MAX 4 tablets per day, administered as 1 tablet in the morning, 1 tablet in mid-afternoon and 2 tablets at bedtime. Patient not taking: Reported on 10/07/2023 04/13/23   Macario Dorothyann HERO, MD  Iron , Ferrous Sulfate , 325 (65 Fe) MG TABS Take 1 tablet by mouth every other day. 08/03/23   Macario Dorothyann HERO, MD  Prenatal MV & Min w/FA-DHA (PRENATAL ADULT GUMMY/DHA/FA) 0.4-25 MG CHEW Chew 1 tablet by mouth daily.    [provider]    Prenatal care site: [facility]   Social History: She  reports that she has never smoked. She has never been exposed to tobacco smoke. She has never used smokeless tobacco. She reports that she does not currently use alcohol. She reports that she does not use drugs.  Family History: family history includes Healthy in her brother, daughter, half-brother, half-sister, half-sister, sister, and sister; Hypertension in her maternal grandmother and mother.   Review of Systems: A full review of systems was performed and negative except as noted in the HPI.     Physical Exam:  Vital Signs: BP (!) 141/77 (BP Location: Right Arm)   Pulse 93   Temp 97.8 F (36.6 C) (Oral)   Resp 17   LMP 01/21/2023 (Approximate)  General: no acute distress.  HEENT: normocephalic, atraumatic Heart: regular rate & rhythm.  No murmurs/rubs/gallops Lungs: clear to auscultation bilaterally, normal respiratory effort Abdomen: soft, gravid, non-tender;  EFW: 5.5lb Pelvic:   External: Normal external female genitalia  Cervix: 1/50/-1, medium, posterior   Extremities: non-tender, symmetric, mild edema bilaterally.  DTRs: +2  Neurologic: Alert & oriented x 3.    Results for orders placed or performed during the hospital encounter of 10/13/23 (from the  past 24 hours)  Protein / creatinine ratio, urine     Status: None   Collection Time: 10/13/23  1:03 PM  Result Value Ref Range   Creatinine, Urine 112 mg/dL   Total Protein, Urine 12 mg/dL   Protein Creatinine Ratio 0.11 0.00 - 0.15 mg/mg[Cre]  Urine Drug Screen, Qualitative ([facility] only)     Status: None   Collection Time: 10/13/23  1:03 PM  Result Value Ref Range   Tricyclic, Ur Screen NONE DETECTED NONE DETECTED   Amphetamines, Ur Screen NONE DETECTED NONE DETECTED   MDMA (Ecstasy)Ur Screen NONE  DETECTED NONE DETECTED   Cocaine Metabolite,Ur [place] NONE DETECTED NONE DETECTED   Opiate, Ur Screen NONE DETECTED NONE DETECTED   Phencyclidine (PCP) Ur S NONE DETECTED NONE DETECTED   Cannabinoid 50 Ng, Ur [place] NONE DETECTED NONE DETECTED   Barbiturates, Ur Screen NONE DETECTED NONE DETECTED   Benzodiazepine, Ur Scrn NONE DETECTED NONE DETECTED   Methadone Scn, Ur NONE DETECTED NONE DETECTED  Comprehensive metabolic panel     Status: Abnormal   Collection Time: 10/13/23  1:25 PM  Result Value Ref Range   Sodium 135 135 - 145 mmol/L   Potassium 3.7 3.5 - 5.1 mmol/L   Chloride 104 98 - 111 mmol/L   CO2 22 22 - 32 mmol/L   Glucose, Bld 88 70 - 99 mg/dL   BUN 8 6 - 20 mg/dL   Creatinine, Ser 9.45 0.44 - 1.00 mg/dL   Calcium  9.4 8.9 - 10.3 mg/dL   Total Protein 6.9 6.5 - 8.1 g/dL   Albumin 3.2 (L) 3.5 - 5.0 g/dL   AST 22 15 - 41 U/L   ALT 12 0 - 44 U/L   Alkaline Phosphatase 90 38 - 126 U/L   Total Bilirubin 0.5 0.0 - 1.2 mg/dL   GFR, Estimated >39 >39 mL/min   Anion gap 9 5 - 15  Type and screen [facility]     Status: None (Preliminary result)   Collection Time: 10/13/23  1:25 PM  Result Value Ref Range   ABO/RH(D) PENDING    Antibody Screen PENDING    Sample Expiration      10/16/2023,2359 Performed at [facility], [address] [place], [place] [address]   Fibrinogen      Status: None   Collection Time: 10/13/23  1:25 PM  Result Value Ref Range   Fibrinogen  446 210 - 475 mg/dL  APTT     Status: None   Collection Time: 10/13/23  1:25 PM  Result Value Ref Range   aPTT 29 24 - 36 seconds  Protime-INR     Status: None   Collection Time: 10/13/23  1:25 PM  Result Value Ref Range   Prothrombin Time 13.1 11.4 - 15.2 seconds   INR 0.9 0.8 - 1.2  TSH     Status: None   Collection Time: 10/13/23  1:25 PM  Result Value Ref Range   TSH 2.487 0.350 - 4.500 uIU/mL    Pertinent Results:  Prenatal Labs: Blood type/Rh O negative  Antibody screen  neg  Rubella Immune  Varicella pending  RPR NR  HBsAg Neg  HIV NR  GC neg  Chlamydia neg  Genetic screening negative  1 hour GTT 65  3 hour GTT   GBS Positive by NOB urine   FHT: no fetal heart rate tracing TOCO: flat SVE:  1/50/-1, medium, posterior   Cephalic by leopolds  US  OB Limited Result Date: 10/13/2023 CLINICAL  DATA:  [id number] Pregnancy [id number] Non-reassuring fetal heart tones complicating pregnancy, antepartum [phone], no fetal heart tones EXAM: LIMITED OBSTETRIC ULTRASOUND COMPARISON:  April 20, 2023 FINDINGS: Number of Fetuses: 1 Heart Rate: No fetal heartbeat present during the exam. Movement: Not present Presentation: Cephalic Placental Location: Anterior Previa: No Amniotic Fluid (Subjective):  Within normal limits. AFI: 4.6 cm FL: 6.9 cm 35 w  2 d MATERNAL FINDINGS: Cervix:  Appears closed. Uterus/Adnexae: No abnormality visualized. IMPRESSION: Single intrauterine gestation in cephalic presentation with an estimated gestational age of [redacted] weeks and 2 days. No fetal heartbeat or fetal movement present during the exam, consistent with intrauterine fetal demise. These results will be called to the ordering clinician or representative by the Radiologist Assistant and communication documented in the PACS or [employer]. Electronically Signed   By: Rogelia Myers M.D.   On: 10/13/2023 13:10    Assessment:  Meghan Peck is a 28 y.o. G2P1001 female at [redacted]w[redacted]d with IUFD.   Plan:  1. Admit to Labor & Delivery; consents reviewed and obtained - Dr. CHARM Schermerhorn notified of admission and plan of care  2. Fetal Well being  - Fetal Tracing: n/a, no FHR monitoring indicated - Group B Streptococcus ppx indicated: n/a, GBS Positive but fetal status does not indicate GBS treatment - Presentation: vertex confirmed by US    3. Routine OB: - Prenatal labs reviewed, as above - Rh negative, will evaluate fetal cord blood if possible to determine Rhogam need  postpartum - CBC, T&S, RPR on admit - Clear fluids, saline lock  4. Induction of Labor -  Contractions rare, external toco in place -  Pelvis proven to 3005g, adequate for trial of labor -  Plan for induction with cytotec , pitocin , AROM as indicated -  Plan for continuous contraction monitoring -  Maternal pain control as desired; undecided about pain control at this time, available to have epidural and/or IVPM - Anticipate vaginal delivery  5. Post Partum Planning: - Contraception: undecided - Tdap: received 08/03/23 - RSV: received 09/23/23 - Flu: out of season  6. IUFD: - Check TORCH and infection labs  - Evaluate for placenta abruption (PIH labs, KB stain) - Normal MaterniT21 in pregnancy - Karyotyping after delivery - Patient provided information about funeral services and/or autopsy - Offered chaplain services, she will consider - Placenta to pathology - She would like to see the baby so will place the baby on her abdomen at delivery and will provide mementos and photos as able.   Edsel Charlies Blush, CNM 10/13/23 3:02 PM

## 2023-10-13 NOTE — H&P (Signed)
 OB/GYN Labor and Delivery H&P Name: Meghan Peck 26-Jul-1995 Date: 10/13/2023   Time: 5:09 PM   Room/Bed: LDR6/LDR6   CC:  decreased fetal movement  Subjective   HPI: Meghan Peck is a 28 y.o. G2P1001 at [redacted]w[redacted]d by -/13 who presents to triage for evaluation of decreased fetal movement.   I was called to the room by Edsel Blush, CNM due to absence of cardiac activity on ultrasound.   Denies VB, contractions, leakage of fluid. Reports decreased fetal movement but unable to specifically indicate timeline and when movement was last normal. States she has an anterior placenta and therefore has never felt normal movement. In general does not feel kicks just sometimes keep movement of the butt.   Has been feeling well/normal. No issues. Reports pregnancy has been uncomplicated. Has not been sick recently or been around anyone sick. Denies headache, vision changes, SOB, CP, dysuria, hematuria, cough, sore throat, runny nose, fever, chills, sweats.    Her pregnancy is complicated by: S<D- per records measured 38 weeks- 35 cm 36 weeks- 33 cm [redacted]w[redacted]d weeks- 31 cm  [redacted]w[redacted]d- 30 cm [redacted]w[redacted]d- 26 cm Anemia GBS bacteriuria Rh negative- received rhogam on 08/03/23 Hx of LSIL pap   ROS: Negative except that noted in HPI.   Review of Systems  Constitutional:  Negative for chills and fever.  HENT:  Negative for congestion and sore throat.   Eyes:  Negative for blurred vision and double vision.  Respiratory:  Negative for cough and shortness of breath.   Cardiovascular:  Negative for chest pain.  Gastrointestinal:  Negative for abdominal pain, constipation, diarrhea, nausea and vomiting.  Genitourinary:  Negative for dysuria and hematuria.  Skin:  Negative for rash.  Neurological:  Negative for headaches.     PRENATAL CARE PROVIDER: [facility]   OBHx:    OB History  Gravida Para Term Preterm AB Living  2 1 1  0 0 1  SAB IAB Ectopic Multiple Live Births  0 0 0 0 1    # Outcome Date GA  Lbr Len/2nd Weight Sex Type Anes PTL Lv  2 Current           1 Term 04/03/20 [redacted]w[redacted]d  3005 g F Vag-Spont  N LIV     PROBLEM LIST:  Patient Active Problem List   Diagnosis Date Noted   Decreased fetal movement 10/13/2023   IUFD at 20 weeks or more of gestation 10/13/2023   Iron  deficiency anemia during pregnancy 08/03/2023   Low grade squamous intraepithelial lesion on cytologic smear of cervix (LGSIL) 05/11/2023   Uterine fibroids affecting pregnancy 04/21/2023   GBS bacteriuria 04/17/2023   Supervision of other normal pregnancy, antepartum 04/13/2023   Blood type, Rh negative 09/28/2019     PMHx:    Past Medical History:  Diagnosis Date   Anemia 08/03/2023   Dyspnea    states sob when she gets hot     PSHx:    Past Surgical History:  Procedure Laterality Date   denies     denies hx of c section     MEDS:    No current facility-administered medications on file prior to encounter.   Current Outpatient Medications on File Prior to Encounter  Medication Sig Dispense Refill   Doxylamine -Pyridoxine  10-10 MG TBEC (Diclegis (R)) Initial, 2 tablets (doxylamine  succinate 10 mg/pyridoxine  hydrochloride 10 mg per tablet) orally at bedtime on day 1 and 2; if symptoms persist, take 1 tablet in morning and 2 tablets at bedtime on day 3; if  symptoms persist, may increase to MAX 4 tablets per day, administered as 1 tablet in the morning, 1 tablet in mid-afternoon and 2 tablets at bedtime. (Patient not taking: Reported on 10/07/2023) 60 tablet 2   Iron , Ferrous Sulfate , 325 (65 Fe) MG TABS Take 1 tablet by mouth every other day.     Prenatal MV & Min w/FA-DHA (PRENATAL ADULT GUMMY/DHA/FA) 0.4-25 MG CHEW Chew 1 tablet by mouth daily.       ALLERGIES:   No Known Allergies   FAMILY Hx:    Family History  Problem Relation Age of Onset   Hypertension Mother    Healthy Sister    Healthy Sister    Healthy Brother    Healthy Daughter    Hypertension Maternal Grandmother    Healthy  Half-Brother    Healthy Half-Sister    Healthy Half-Sister      SOCIAL Hx:    Social History   Tobacco Use   Smoking status: Never    Passive exposure: Never   Smokeless tobacco: Never  Substance Use Topics   Alcohol use: Not Currently    Comment: last use - 2 mo ago- approx 12/2022- four loco     Objective   Vitals: BP (!) 141/77 (BP Location: Right Arm)   Pulse 93   Temp 97.8 F (36.6 C) (Oral)   Resp 17   LMP 01/21/2023 (Approximate)  There is no height or weight on file to calculate BMI.   Physical exam:  Gen-  Alert and interactive, NAD.  CV-   Regular rate PULM-   Respirations unlabored on room air ABD-   Gravid, soft, nontender Ext-   No LE edema   Prenatal Labs:     Blood type/Rh O NEG Performed at [facility], [address] [place], [place] [address]    Antibody screen Negative    Hb 13.3 (04/13/23) > 10.3 (7/14) > 9.8 (8/19) > 9.4 (9/17)  Plt 299 (04/13/23)  Rubella Immune- 3.15 (03/24 1126)   Varicella Unknown  RPR Non Reactive (07/14 1050)   HBsAg Negative (03/24 1126)  Hep C Neg  HIV NON REACTIVE (09/23 1325)   GC Neg  Chlamydia Neg (3/24, 6/23, and 9/3)  Genetic screening cfDNA negative   1 hour GTT 65  3 hour GTT N/a  A1c   GBS GBSuria      US  OB Limited Result Date: 10/13/2023 CLINICAL DATA:  [id number] Pregnancy [id number] Non-reassuring fetal heart tones complicating pregnancy, antepartum 266962, no fetal heart tones EXAM: LIMITED OBSTETRIC ULTRASOUND COMPARISON:  April 20, 2023 FINDINGS: Number of Fetuses: 1 Heart Rate: No fetal heartbeat present during the exam. Movement: Not present Presentation: Cephalic Placental Location: Anterior Previa: No Amniotic Fluid (Subjective):  Within normal limits. AFI: 4.6 cm FL: 6.9 cm 35 w  2 d MATERNAL FINDINGS: Cervix:  Appears closed. Uterus/Adnexae: No abnormality visualized. IMPRESSION: Single intrauterine gestation in cephalic presentation with an estimated gestational age of [redacted] weeks  and 2 days. No fetal heartbeat or fetal movement present during the exam, consistent with intrauterine fetal demise. These results will be called to the ordering clinician or representative by the Radiologist Assistant and communication documented in the PACS or [employer]. Electronically Signed   By: Rogelia Myers M.D.   On: 10/13/2023 13:10     BSUS: No cardiac activity. No movement. Cephalic. Subjectively very low amniotic fluid.    Assessment & Plan   Ellerie Arenz is a 28 y.o. G2P1001 at [redacted]w[redacted]d by -/  13 who presents to triage for evaluation of decreased fetal movement and was found to have an intrauterine fetal demise   #Intrauterine fetal demise- dx at [redacted]w[redacted]d on 10/13/23 #Oligohydramnios- dx 10/13/23 #Size < dates - Admit to L&D - IUFD confirmed on BSUS by Edsel Blush, CNM and then myself and by official u/s.  - Rh neg- s/p rhogam on 08/03/23. Rh work-up postpartum.  - Etiology unknown at this point. Patient asymptomatic with normal vitals other than mild range BP. Exam normal.  - Size date discrepancy since 28 weeks noted. No growth u/s. FL measuring behind at [redacted]w[redacted]d today. Suspect FGR.  - Oligohydramnios diagnosed on ultrasound today. Unclear if this was present prior to IUFD.  - Discussed diagnosis with patient. - Work-up:  Labs ordered: preE labs, TSH, A1c, KB, viral titers (CMV, varicella, parvo), coags, type & screen, antiphospholipid antibodies (LAC, ACA, anti-Beta-2 -glycoprotein), RPR, HIV, UDS, Ucx Placental pathology- plan to send placenta Fetal autopsy- discussed that this is recommended for all stillbirths.  She is interested and but is concerned about cost Fetal karyotype with reflex to microarray if fetal cell growth is not adequate for karyotype- patient interested Plan for careful evaluation of fetus after delivery  - Discussed recommendation for proceeding with delivery. Discussed recommendation for IOL with goal of a vaginal delivery but also discussed  option of cesarean section. Patient wants to wait for her family to get here but thinks she wants to proceed with an IOL.   #Elevated BP - First BP mild range. No si/sx of preE - Will continue to monitor - PreE labs ordered  #Anemia - Hb 9.8 (09/08/23) > 9.4 (10/07/23) > CBC ordered  #LSIL pap - 04/13/23 LSIL  - Plan for postpartum colposcopy  #PPX: SCDs ordered    Beverli LULLA Dinsmore, MD 10/13/2023 5:09 PM

## 2023-10-14 ENCOUNTER — Ambulatory Visit

## 2023-10-14 LAB — CBC
HCT: 40.2 % (ref 36.0–46.0)
Hemoglobin: 12.8 g/dL (ref 12.0–15.0)
MCH: 25 pg — ABNORMAL LOW (ref 26.0–34.0)
MCHC: 31.8 g/dL (ref 30.0–36.0)
MCV: 78.7 fL — ABNORMAL LOW (ref 80.0–100.0)
Platelets: 272 K/uL (ref 150–400)
RBC: 5.11 MIL/uL (ref 3.87–5.11)
RDW: 17.1 % — ABNORMAL HIGH (ref 11.5–15.5)
WBC: 11.9 K/uL — ABNORMAL HIGH (ref 4.0–10.5)
nRBC: 0 % (ref 0.0–0.2)

## 2023-10-14 LAB — PARVOVIRUS B19 ANTIBODY, IGG AND IGM
Parovirus B19 IgG Abs: 2.1 {index} — ABNORMAL HIGH (ref 0.0–0.8)
Parovirus B19 IgM Abs: 0.4 {index} (ref 0.0–0.8)

## 2023-10-14 LAB — URINE CULTURE: Culture: NO GROWTH

## 2023-10-14 LAB — HEMOGLOBIN A1C
Hgb A1c MFr Bld: 5.1 % (ref 4.8–5.6)
Mean Plasma Glucose: 99.67 mg/dL

## 2023-10-14 LAB — CMV IGM: CMV IgM: <30 [AU]/ml (ref 0.0–29.9)

## 2023-10-14 LAB — FETAL SCREEN: Fetal Screen: NEGATIVE

## 2023-10-14 LAB — RPR: RPR Ser Ql: NONREACTIVE

## 2023-10-14 LAB — VARICELLA ZOSTER ANTIBODY, IGG: Varicella IgG: REACTIVE

## 2023-10-14 LAB — CMV ANTIBODY, IGG (EIA): CMV Ab - IgG: <0.6 U/mL (ref 0.00–0.59)

## 2023-10-14 MED ORDER — SIMETHICONE 80 MG PO CHEW: 80.0000 mg | CHEWABLE_TABLET | ORAL | Status: AC | PRN

## 2023-10-14 MED ORDER — IBUPROFEN 600 MG PO TABS: 600.0000 mg | ORAL_TABLET | Freq: Four times a day (QID) | ORAL | 0 refills | Status: AC

## 2023-10-14 MED ORDER — SENNOSIDES-DOCUSATE SODIUM 8.6-50 MG PO TABS: 2.0000 | ORAL_TABLET | Freq: Every day | ORAL | Status: AC

## 2023-10-14 MED ORDER — RHO D IMMUNE GLOBULIN 1500 UNIT/2ML IJ SOSY
300.0000 ug | PREFILLED_SYRINGE | Freq: Once | INTRAMUSCULAR | Status: AC
  Administered 2023-10-14: 300 ug via INTRAMUSCULAR
  Filled 2023-10-14: qty 2

## 2023-10-14 MED ORDER — DIBUCAINE (PERIANAL) 1 % EX OINT: 1.0000 | TOPICAL_OINTMENT | CUTANEOUS | Status: AC | PRN

## 2023-10-14 MED ORDER — BENZOCAINE-MENTHOL 20-0.5 % EX AERO: 1.0000 | INHALATION_SPRAY | CUTANEOUS | Status: AC | PRN

## 2023-10-14 MED ORDER — WITCH HAZEL-GLYCERIN EX PADS: 1.0000 | MEDICATED_PAD | CUTANEOUS | Status: AC | PRN

## 2023-10-14 MED ORDER — ACETAMINOPHEN 325 MG PO TABS: 650.0000 mg | ORAL_TABLET | ORAL | Status: AC | PRN

## 2023-10-14 NOTE — TOC Progression Note (Signed)
 Transition of Care [facility]) - Progression Note    Patient Details  Name: Meghan Peck MRN: [id number] Date of Birth: Jan 18, 1996  Transition of Care [facility]) CM/SW Contact  K'La JINNY Ruts, LCSW Phone Number: 10/14/2023, 11:04 AM  Clinical Narrative:    Chart reviewed. Please note that the patient has a fetal demise at 38 weeks. I received a consult to go see the patient and provide resources for grief and loss. I spoke with the patient at bedside today. I introduced myself, my role, and reason for consult. The patient confirmed her address and telephone number. The patient reports that she has her aunt as support.   I asked the patient if she would like any grief and loss resources and/or behavioral health resources. The patient politely declined. I encouraged the patient to reach out to [facility] if any needs arise before her discharge. The patient verbalized understanding.   TOC will follow the patient until discharge.                     Expected Discharge Plan and Services                                               Social Drivers of Health (SDOH) Interventions SDOH Screenings   Food Insecurity: No Food Insecurity (10/13/2023)  Housing: Low Risk  (10/13/2023)  Transportation Needs: No Transportation Needs (10/13/2023)  Utilities: Not At Risk (10/13/2023)  Depression (PHQ2-9): Low Risk  (03/16/2023)  Financial Resource Strain: Low Risk  (04/13/2023)  Tobacco Use: Low Risk  (09/24/2023)   Received from [facility]    Readmission Risk Interventions     No data to display

## 2023-10-14 NOTE — Progress Notes (Signed)
   10/14/23 1246  Spiritual Encounters  Type of Visit Initial  Care provided to: Patient  Conversation partners present during encounter Nurse  Referral source Patient request  Reason for visit Grief/loss  OnCall Visit Yes  Spiritual Framework  Presenting Themes Significant life change;Coping tools;Impactful experiences and emotions  Interventions  Spiritual Care Interventions Made Established relationship of care and support;Compassionate presence;Reflective listening;Normalization of emotions;Prayer  Intervention Outcomes  Outcomes Connection to spiritual care;Awareness of support  Spiritual Care Plan  Spiritual Care Issues Still Outstanding No further spiritual care needs at this time (see row info)   Chaplain requested to come and pray for pt before stillborn was taken downstairs. Chaplain prayed with pt, aunt, and nurse who brought baby into the room.

## 2023-10-15 LAB — RHOGAM INJECTION: Unit division: 0

## 2023-10-15 LAB — LUPUS ANTICOAGULANT PANEL
DRVVT: 24.6 s (ref 0.0–47.0)
PTT Lupus Anticoagulant: 30.3 s (ref 0.0–43.5)

## 2023-10-15 LAB — BETA-2-GLYCOPROTEIN I ABS, IGG/M/A
Beta-2 Glyco I IgG: <9 GPI IgG units (ref 0–20)
Beta-2-Glycoprotein I IgA: <9 GPI IgA units (ref 0–25)
Beta-2-Glycoprotein I IgM: <9 GPI IgM units (ref 0–32)

## 2023-10-16 ENCOUNTER — Ambulatory Visit: Payer: Self-pay | Admitting: Obstetrics and Gynecology

## 2023-10-16 LAB — CARDIOLIPIN ANTIBODIES, IGG, IGM, IGA
Anticardiolipin IgA: <9 U/mL (ref 0–11)
Anticardiolipin IgG: <9 GPL U/mL (ref 0–14)
Anticardiolipin IgM: <9 [MPL'U]/mL (ref 0–12)

## 2023-10-16 LAB — SURGICAL PATHOLOGY

## 2023-10-17 LAB — RAPID HIV SCREEN (HIV 1/2 AB+AG)
HIV 1/2 Antibodies: NONREACTIVE
HIV-1 P24 Antigen - HIV24: NONREACTIVE

## 2023-10-19 ENCOUNTER — Other Ambulatory Visit: Payer: Self-pay | Admitting: Obstetrics and Gynecology

## 2023-10-19 ENCOUNTER — Other Ambulatory Visit
Admission: RE | Admit: 2023-10-19 | Discharge: 2023-10-19 | Disposition: A | Discharge status: Home / Self Care | Source: Ambulatory Visit | Attending: Obstetrics and Gynecology | Admitting: Obstetrics and Gynecology

## 2023-10-19 ENCOUNTER — Telehealth: Payer: Self-pay

## 2023-10-19 DIAGNOSIS — O364XX0 Maternal care for intrauterine death, not applicable or unspecified: Secondary | ICD-10-CM

## 2023-10-19 NOTE — Telephone Encounter (Signed)
 Nurse follow-up condolence phone call. Offered patient 2 week follow up postpartum visit. Patient declined. Made aware of [facility] Social Work Services and the need for a 6 week postpartum appointment. Patient did not desire to schedule at this time. BThiele RN

## 2023-10-19 NOTE — Progress Notes (Signed)
 9/29/251244: Fetal remains were released to [facility] after confirming remains being taken to [facility] and reviewing tag on bag.

## 2023-10-19 NOTE — Progress Notes (Signed)
 On 10/14/23 at 1320 fetal remains released to [facility] transport for transportation to [facility] for autopsy.   Over the weekend the fetal remains were returned to [facility].  On 10/19/23 at 0700, Debby Borrow RN [employer] spoke with a representative of [facility] to make them aware, remains were available for transport and would be doing hospital paid for cre[facility]ation

## 2023-10-20 NOTE — Telephone Encounter (Signed)
 10/20/23 colpo referral sent to [facility] through Epic's referrals tab.

## 2023-11-03 LAB — POC/TISSUE MICROARRAY

## 2023-11-18 ENCOUNTER — Encounter: Admitting: Obstetrics & Gynecology

## 2023-12-23 NOTE — Progress Notes (Signed)
  Chief Complaint:    Ms. Zanders is a 28 y.o. female here for Colposcopy .pt here for colposcopic eval for abnomal pap in pregnancy . Pregnancy ended in IUFD   Pap 3/25 LGSIL , no HPV done    Past Medical History:  has no past medical history on file.  Past Surgical History:  has a past surgical history that includes colposcopy (12/23/2023). Family History: family history includes High blood pressure (Hypertension) in her mother. Social History:  reports that she has never smoked. She has never used smokeless tobacco. She reports that she does not drink alcohol and does not use drugs. OB/GYN History:  OB History     Gravida  2   Para  2   Term  2   Preterm      AB      Living  1      SAB      IAB      Ectopic      Molar      Multiple      Live Births  1          Allergies: has no known allergies. Medications: No current outpatient medications on file.  Review of Systems: General:   No fatigue or weight loss Eyes:   No vision changes Ears:   No hearing difficulty Respiratory:                No cough or shortness of breath Pulmonary:   No asthma or shortness of breath Cardiovascular:        No chest pain, palpitations, dyspnea on exertion Gastrointestinal:          No abdominal bloating, chronic diarrhea, constipations, masses, pain or hematochezia Genitourinary:  No hematuria, dysuria, abnormal vaginal discharge, pelvic pain, Menometrorrhagia Lymphatic:  No swollen lymph nodes Musculoskeletal: No muscle weakness Neurologic:  No extremity weakness, syncope, seizure disorder Psychiatric:  No history of depression, delusions or suicidal/homicidal ideation    Exam:   Vitals:   12/23/23 1448  BP: 118/79  Pulse: 98    Body mass index is 26.48 kg/m.  WDWN / black female in NAD   Lungs: CTA  CV : RRR without murmur   Pelvic: tanner stage 5 ,  External genitalia: vulva /labia no lesions Urethra: no prolapse Vagina: normal physiologic d/c Cervix:  no lesions, no cervical motion tenderness   Uterus: normal size shape and contour, non-tender Adnexa: no mass,  non-tender   Rectovaginal:  After discussion she agrees to colposcopic eval  Colposcopic exam: A speculum is placed and 5% acetic acid is applied to the cervix. There is acetowhite epithelium at 3+9o"clock. There is  no puncation .  There is  no mosacism . Representative biopsies are done at 3/9 oclock.  An ECC is also done. Monsel solution is applied with good hemostasis.   Impression:   The encounter diagnosis was LGSIL on Pap smear of cervix.    Plan:   Cxbx , ecc Management of results based on results . If neg I will repeat pap in 6 months . If TOC in future start PNV well before . She wishes to have [facility]    Orders Placed This Encounter  Procedures  . Pathology Report - [facility]    Cervical Biopsy @ 3 oclock, 9 oclock and 1 ECC    Release to patient:   Immediate    No follow-ups on file.  DEBBY CLARYCE DINSMORE, MD

## 2023-12-25 ENCOUNTER — Emergency Department
Admission: EM | Admit: 2023-12-25 | Discharge: 2023-12-25 | Disposition: A | Discharge status: Home / Self Care | Attending: Emergency Medicine | Admitting: Emergency Medicine

## 2023-12-25 ENCOUNTER — Emergency Department

## 2023-12-25 ENCOUNTER — Other Ambulatory Visit: Payer: Self-pay

## 2023-12-25 DIAGNOSIS — R051 Acute cough: Secondary | ICD-10-CM | POA: Insufficient documentation

## 2023-12-25 DIAGNOSIS — R197 Diarrhea, unspecified: Secondary | ICD-10-CM | POA: Insufficient documentation

## 2023-12-25 DIAGNOSIS — R1110 Vomiting, unspecified: Secondary | ICD-10-CM | POA: Insufficient documentation

## 2023-12-25 LAB — RESP PANEL BY RT-PCR (RSV, FLU A&B, COVID)  RVPGX2
Influenza A by PCR: NEGATIVE
Influenza B by PCR: NEGATIVE
Resp Syncytial Virus by PCR: NEGATIVE
SARS Coronavirus 2 by RT PCR: NEGATIVE

## 2023-12-25 MED ORDER — PSEUDOEPH-BROMPHEN-DM 30-2-10 MG/5ML PO SYRP: 10.0000 mL | ORAL_SOLUTION | Freq: Four times a day (QID) | ORAL | 0 refills | Status: AC | PRN

## 2023-12-25 NOTE — ED Provider Notes (Signed)
-----------------------------------------   6:21 PM on 12/25/2023 -----------------------------------------  Blood pressure (!) 146/90, pulse 84, temperature 97.8 F (36.6 C), resp. rate 19, height 5' (1.524 m), weight 61.2 kg, last menstrual period 12/15/2023, SpO2 98%, not currently breastfeeding.  Assuming care from [place], PA-C.  In short, Meghan Peck is a 29 y.o. female with a chief complaint of URI symptoms.  Refer to the original H&P for additional details.  .  Chest x-ray is negative for acute concerns.  Respiratory panel is negative.  Results reviewed with the patient.  Bromfed submitted to the patient's pharmacy and she was advised to use Tylenol  or ibuprofen  if needed.       Medications - No data to display   ED Discharge Orders          Ordered    brompheniramine-pseudoephedrine-DM 30-2-10 MG/5ML syrup  4 times daily PRN        12/25/23 1826           Final diagnoses:  Acute cough  Vomiting and diarrhea      Herlinda Kirk NOVAK, FNP 12/25/23 2300    Bradler, Evan K, MD 12/25/23 2321

## 2023-12-25 NOTE — ED Provider Notes (Signed)
 [facility] Provider Note    Event Date/Time   First MD Initiated Contact with Patient 12/25/23 1612     (approximate)   History   URI   HPI  Liyana Suniga is a 28 y.o. female with a past medical history of anemia presents to the emergency department with cough, nasal congestion, vomiting x 2 weeks.  Patient reports her child was sick as well last week.  Reports the vomiting occurs only after eating meals. Last episode of vomiting was last night.  Patient denies fevers, dyspnea, chest pain, abdominal pain, sore throat, otalgia. Has had some intermittent episodes of diarrhea.  She does not smoke or drink.  She does not have a history of asthma.  She has been using Robitussin and Delsym at home for her symptoms with minimal relief.  I reviewed the medical chart. Patient had recent spontaneous vaginal delivery on 10/13/2023 with stillbirth, IUFD.  Physical Exam   Triage Vital Signs: ED Triage Vitals  Encounter Vitals Group     BP 12/25/23 1554 (!) 146/90     Girls Systolic BP Percentile --      Girls Diastolic BP Percentile --      Boys Systolic BP Percentile --      Boys Diastolic BP Percentile --      Pulse Rate 12/25/23 1554 84     Resp 12/25/23 1554 19     Temp 12/25/23 1554 97.8 F (36.6 C)     Temp src --      SpO2 12/25/23 1554 98 %     Weight 12/25/23 1555 135 lb (61.2 kg)     Height 12/25/23 1555 5' (1.524 m)     Head Circumference --      Peak Flow --      Pain Score 12/25/23 1554 0     Pain Loc --      Pain Education --      Exclude from Growth Chart --     Most recent vital signs: Vitals:   12/25/23 1554  BP: (!) 146/90  Pulse: 84  Resp: 19  Temp: 97.8 F (36.6 C)  SpO2: 98%    General: Awake, in no acute distress. Appears stated age. Head: Normocephalic, atraumatic. Eyes: No scleral icterus or conjunctival injection. Ears/Nose/Throat: TMs intact b/l. Nares patent, no nasal discharge. Oropharynx moist, no erythema  or exudate. Dentition intact. Neck: Supple, no lymphadenopathy. CV: Good peripheral perfusion. RRR, 84 bpm. Respiratory:Normal respiratory effort.  No respiratory distress. CTAB. No wheezes, rales or rhonchi. GI: Soft, non-distended, non-tender. No rebound or guarding. Skin:Warm, dry, intact. No rashes, lesions, or ecchymosis.   ED Results / Procedures / Treatments   Labs (all labs ordered are listed, but only abnormal results are displayed) Labs Reviewed  RESP PANEL BY RT-PCR (RSV, FLU A&B, COVID)  [id number]     EKG     RADIOLOGY CXR ordered.    PROCEDURES:  Critical Care performed: No   Procedures   MEDICATIONS ORDERED IN ED: Medications - No data to display   IMPRESSION / MDM / ASSESSMENT AND PLAN / ED COURSE  I reviewed the triage vital signs and the nursing notes.                              Differential diagnosis includes, but is not limited to, pneumonia, COVID, Influenza, RSV, gastroenteritis, bronchitis  Patient's presentation is most consistent with acute complicated illness /  injury requiring diagnostic workup.  Patient is a 28 year old female here with cough, nasal congestion, vomiting and few episodes of diarrhea for the past two weeks.  She is afebrile and well-appearing on exam. No current vomiting. Respiratory panel ordered in triage. I ordered chest x-ray to evaluate for pneumonia. CXR is pending at this time.   This is at the end of my shift. Kirk Landry Gentry, NP, will take over this patient's care and follow up with CXR results. Current plan for symptomatic and supportive treatment +/- antibiotics if CXR shows evidence of pneumonia.   FINAL CLINICAL IMPRESSION(S) / ED DIAGNOSES   Final diagnoses:  Acute cough  Vomiting and diarrhea     Rx / DC Orders   ED Discharge Orders     None        Note:  This document was prepared using Dragon voice recognition software and may include unintentional dictation errors.     Sheron Salm,  PA-C 12/25/23 1758    Bradler, Evan K, MD 12/25/23 2258

## 2023-12-25 NOTE — Discharge Instructions (Signed)
 Follow-up with your primary care provider if you are not improving over the week.  Return to the emergency department for symptoms that change or worsen if you are unable to schedule appointment.

## 2023-12-25 NOTE — ED Triage Notes (Signed)
 Pt comes in via pov with complaints of cold symptoms that started two weeks Pt complains of cough, congestion, and vomiting. Pt's last episode of emesis was yesterday afternoon. Pt has no complaints of pain at this time.

## 2023-12-25 NOTE — ED Notes (Signed)
 See triage note  Presents with some congestion and cough  Sx' s started about 2 weeks ago  Developed some n/v   But last time vomited was yesterday Afebrile on arrival

## 2024-01-05 ENCOUNTER — Encounter: Admitting: Obstetrics & Gynecology
